# Patient Record
Sex: Male | Born: 1965 | Race: White | Hispanic: No | Marital: Married | Smoking: Former smoker
Health system: Southern US, Community
[De-identification: ages and names within clinical notes are randomized; demographics above are authoritative.]

## PROBLEM LIST (undated history)

## (undated) ENCOUNTER — Emergency Department: Disposition: A | Discharge status: Home / Self Care

## (undated) DIAGNOSIS — I10 Essential (primary) hypertension: Secondary | ICD-10-CM

## (undated) DIAGNOSIS — K219 Gastro-esophageal reflux disease without esophagitis: Secondary | ICD-10-CM

## (undated) DIAGNOSIS — J45909 Unspecified asthma, uncomplicated: Secondary | ICD-10-CM

## (undated) DIAGNOSIS — M1990 Unspecified osteoarthritis, unspecified site: Secondary | ICD-10-CM

## (undated) DIAGNOSIS — F419 Anxiety disorder, unspecified: Secondary | ICD-10-CM

## (undated) HISTORY — PX: OTHER SURGICAL HISTORY: SHX169

## (undated) HISTORY — PX: APPENDECTOMY: SHX54

---

## 2003-03-08 ENCOUNTER — Ambulatory Visit (HOSPITAL_COMMUNITY): Admission: RE | Admit: 2003-03-08 | Discharge: 2003-03-08 | Payer: Self-pay | Admitting: Neurology

## 2003-03-08 ENCOUNTER — Encounter: Payer: Self-pay | Admitting: Neurology

## 2004-03-22 ENCOUNTER — Emergency Department (HOSPITAL_COMMUNITY): Admission: EM | Admit: 2004-03-22 | Discharge: 2004-03-22 | Payer: Self-pay | Admitting: Emergency Medicine

## 2007-02-16 ENCOUNTER — Ambulatory Visit (HOSPITAL_BASED_OUTPATIENT_CLINIC_OR_DEPARTMENT_OTHER): Admission: RE | Admit: 2007-02-16 | Discharge: 2007-02-16 | Payer: Self-pay | Admitting: Orthopaedic Surgery

## 2010-11-05 NOTE — Op Note (Signed)
NAMEBERTIS, HUSTEAD                ACCOUNT NO.:  [id number]   MEDICAL RECORD NO.:  [id number]          PATIENT TYPE:  AMB   LOCATION:  DSC                          FACILITY:  [facility]   PHYSICIAN:  Sharolyn Douglas, M.D.        DATE OF BIRTH:  [birth year]   DATE OF PROCEDURE:  02/16/2007  DATE OF DISCHARGE:                               OPERATIVE REPORT   DIAGNOSIS:  Spondylosis T9-T10, T10-T11 with persistent thoracolumbar  pain.   PROCEDURE:  1. Bilateral fact joint injections at T9-T10 and T10-T11.  2. Fluoroscopic imaging used for needle placement of the above      injection.   SURGEON:  Sharolyn Douglas, M.D.   ASSISTANT:  None.   ANESTHESIA:  MAC plus local.   COMPLICATIONS:  None.   INDICATIONS:  The patient is a pleasant 45 year old male with persistent  thoracolumbar pain, thought to possibly be secondary to thoracolumbar  spondylosis. Now presents for diagnostic and potentially therapeutic  injections as listed above. Risks, benefits and alternatives were  reviewed.  The patient elected to proceed.   DESCRIPTION OF PROCEDURE:  After informed consent, he was taken to the  operating room.  He was turned prone.  The back was prepped and draped  in the usual sterile fashion.  He underwent sedation by anesthesia.  Fluoroscopy was brought into the field and the T9-T10 and T10-T11 facet  joints were visualized.  A 22-gauge 2-1/2 inch Quincke spinal needles  were advanced into the joints bilaterally at T9-T10 and T10-T11.  Aspiration showed no blood. Injection of 40 mg Depo-Medrol 1 mL 1%  preservative-free lidocaine into and around each joint at T9-T10, T10-  T11 bilaterally.  The patient tolerated the procedure well.  There were  no apparent complications.  He was transferred to recovery in stable  condition.  I reviewed post injection instructions with him. He should  follow up in 2 to 3 weeks.      Sharolyn Douglas, M.D.  Electronically Signed     MC/MEDQ  D:  02/16/2007  T:   02/16/2007  Job:  [id number]

## 2011-04-04 LAB — I-STAT 8, (EC8 V) (CONVERTED LAB)
Acid-base deficit: 2
HCT: 48
Hemoglobin: 16.3
Operator id: 112821
Potassium: 4.2
TCO2: 25
pCO2, Ven: 42.4 — ABNORMAL LOW
pH, Ven: 7.357 — ABNORMAL HIGH

## 2015-06-29 DIAGNOSIS — K529 Noninfective gastroenteritis and colitis, unspecified: Secondary | ICD-10-CM | POA: Diagnosis present

## 2015-10-03 DIAGNOSIS — M545 Low back pain, unspecified: Secondary | ICD-10-CM | POA: Insufficient documentation

## 2015-10-03 DIAGNOSIS — R569 Unspecified convulsions: Secondary | ICD-10-CM | POA: Insufficient documentation

## 2015-10-03 DIAGNOSIS — N400 Enlarged prostate without lower urinary tract symptoms: Secondary | ICD-10-CM | POA: Insufficient documentation

## 2015-10-03 DIAGNOSIS — G43009 Migraine without aura, not intractable, without status migrainosus: Secondary | ICD-10-CM | POA: Insufficient documentation

## 2015-10-03 DIAGNOSIS — M5481 Occipital neuralgia: Secondary | ICD-10-CM | POA: Insufficient documentation

## 2015-10-03 DIAGNOSIS — G894 Chronic pain syndrome: Secondary | ICD-10-CM | POA: Insufficient documentation

## 2015-10-03 DIAGNOSIS — M542 Cervicalgia: Secondary | ICD-10-CM | POA: Insufficient documentation

## 2015-10-03 DIAGNOSIS — I10 Essential (primary) hypertension: Secondary | ICD-10-CM | POA: Insufficient documentation

## 2015-10-03 DIAGNOSIS — R519 Headache, unspecified: Secondary | ICD-10-CM | POA: Insufficient documentation

## 2015-10-03 DIAGNOSIS — F32A Depression, unspecified: Secondary | ICD-10-CM | POA: Insufficient documentation

## 2015-10-03 DIAGNOSIS — J45909 Unspecified asthma, uncomplicated: Secondary | ICD-10-CM | POA: Insufficient documentation

## 2017-02-15 DIAGNOSIS — M5136 Other intervertebral disc degeneration, lumbar region: Secondary | ICD-10-CM | POA: Insufficient documentation

## 2017-02-15 DIAGNOSIS — M5126 Other intervertebral disc displacement, lumbar region: Secondary | ICD-10-CM | POA: Insufficient documentation

## 2017-02-15 DIAGNOSIS — S22089A Unspecified fracture of T11-T12 vertebra, initial encounter for closed fracture: Secondary | ICD-10-CM | POA: Insufficient documentation

## 2018-04-15 ENCOUNTER — Encounter: Payer: Self-pay | Admitting: Emergency Medicine

## 2018-04-27 ENCOUNTER — Ambulatory Visit: Payer: Medicare HMO | Admitting: Psychiatry

## 2018-04-27 ENCOUNTER — Encounter: Payer: Self-pay | Admitting: Psychiatry

## 2018-04-27 DIAGNOSIS — F331 Major depressive disorder, recurrent, moderate: Secondary | ICD-10-CM | POA: Diagnosis not present

## 2018-04-27 DIAGNOSIS — F14229 Cocaine dependence with intoxication, unspecified: Secondary | ICD-10-CM | POA: Diagnosis not present

## 2018-04-27 DIAGNOSIS — F1124 Opioid dependence with opioid-induced mood disorder: Secondary | ICD-10-CM

## 2018-04-27 MED ORDER — ARIPIPRAZOLE 20 MG PO TABS: 20.0000 mg | ORAL_TABLET | Freq: Every day | ORAL | 0 refills | Status: DC

## 2018-04-27 MED ORDER — DULOXETINE HCL 60 MG PO CPEP: 120.0000 mg | ORAL_CAPSULE | Freq: Every day | ORAL | 0 refills | Status: DC

## 2018-04-27 NOTE — Progress Notes (Signed)
Alan Nelson [id number] 1966/01/16 52 y.o.  Subjective:   Patient ID:  Alan Nelson is a 52 y.o. (DOB 15-Mar-1966) male.  Chief Complaint:  Chief Complaint  Patient presents with  . Depression  . Sleeping Problem    chronic initial insomnia  . Drug Problem    HPI Alan Nelson presents to the office today for follow-up of TRD.  Was hooked on pain pills and was craving when the doctor took him off morphine 200mg /D and oxycodone 60mg /D for a few years from Dr. Adelene Idler at a pain clinic.  They cut him drastically back bc they got in trouble for over prescribing.  Then got to street to get drugs and go onto cocaine.  Started it about a year.  None of opiates for a few months.  Started Suboxone recently DT craving cocaine.  Suboxone helped craving for cocaine too.  Still has using dreams.  Last use cocaine about 3 mos ago.  Not going to AA, NA bc don't need it.  Got wife and church support.  Couldn't tolerate desipramine DT sedation type side effects.  So he restarted duloxetine and Abilify and it seemed to help again.  Better motivation and less depression.  More desire to do things again.  Asked about increasing it further.     Review of Systems:  Review of Systems  Musculoskeletal: Positive for back pain.  Neurological: Negative for tremors and weakness.  Psychiatric/Behavioral: Negative for agitation, behavioral problems, confusion, decreased concentration, dysphoric mood, hallucinations, self-injury, sleep disturbance and suicidal ideas. The patient is not nervous/anxious and is not hyperactive.     Medications: I have reviewed the patient's current medications.  Current Outpatient Medications  Medication Sig Dispense Refill  . Buprenorphine HCl-Naloxone HCl (SUBOXONE) 8-2 MG FILM Place 16 mg of opioid under the tongue daily.    . DULoxetine (CYMBALTA) 60 MG capsule Take 2 capsules (120 mg total) by mouth daily. 180 capsule 0  . lamoTRIgine (LAMICTAL) 100 MG tablet Take 200  mg by mouth daily.     . ARIPiprazole (ABILIFY) 20 MG tablet Take 1 tablet (20 mg total) by mouth daily. 90 tablet 0   No current facility-administered medications for this visit.     Medication Side Effects: None  Allergies:  Allergies  Allergen Reactions  . Bupropion Other (See Comments)    Seizure   . Desipramine Other (See Comments)    Drunk feeling    History reviewed. No pertinent past medical history.  History reviewed. No pertinent family history.  Social History   Socioeconomic History  . Marital status: Married    Spouse name: Not on file  . Number of children: Not on file  . Years of education: Not on file  . Highest education level: Not on file  Occupational History  . Not on file  Social Needs  . Financial resource strain: Not on file  . Food insecurity:    Worry: Not on file    Inability: Not on file  . Transportation needs:    Medical: Not on file    Non-medical: Not on file  Tobacco Use  . Smoking status: Former Games developer  . Smokeless tobacco: Never Used  Substance and Sexual Activity  . Alcohol use: Not on file  . Drug use: Not on file  . Sexual activity: Not on file  Lifestyle  . Physical activity:    Days per week: Not on file    Minutes per session: Not on file  . Stress:  Not on file  Relationships  . Social connections:    Talks on phone: Not on file    Gets together: Not on file    Attends religious service: Not on file    Active member of club or organization: Not on file    Attends meetings of clubs or organizations: Not on file    Relationship status: Not on file  . Intimate partner violence:    Fear of current or ex partner: Not on file    Emotionally abused: Not on file    Physically abused: Not on file    Forced sexual activity: Not on file  Other Topics Concern  . Not on file  Social History Narrative  . Not on file    Past Medical History, Surgical history, Social history, and Family history were reviewed and updated as  appropriate.   Please see review of systems for further details on the patient's review from today.   Objective:   Physical Exam:  There were no vitals taken for this visit.  Physical Exam  Constitutional: He is oriented to person, place, and time. He appears well-developed. No distress.  Musculoskeletal: He exhibits no deformity.  Neurological: He is alert and oriented to person, place, and time. He displays no tremor. Coordination and gait normal.  Psychiatric: His speech is normal and behavior is normal. Judgment and thought content normal. His mood appears not anxious. His affect is blunt. His affect is not angry, not labile and not inappropriate. Cognition and memory are normal. He exhibits a depressed mood. He expresses no homicidal and no suicidal ideation. He expresses no suicidal plans and no homicidal plans.  Insight and judgment are improving No auditory or visual hallucinations. No delusions.  He is attentive.    Lab Review:     Component Value Date/Time   NA 137 02/16/2007 0727   K 4.2 02/16/2007 0727   CL 104 02/16/2007 0727   GLUCOSE 93 02/16/2007 0727   BUN 20 02/16/2007 0727       Component Value Date/Time   HGB 16.3 02/16/2007 0727   HCT 48.0 02/16/2007 0727    No results found for: POCLITH, LITHIUM   No results found for: PHENYTOIN, PHENOBARB, VALPROATE, CBMZ   .res Assessment: Plan:    Major depressive disorder, recurrent episode, moderate (HCC) - Plan: ARIPiprazole (ABILIFY) 20 MG tablet, DULoxetine (CYMBALTA) 60 MG capsule  Opioid dependence with opioid-induced mood disorder (HCC)  Cocaine dependence with intoxication with complication (HCC) - Plan: ARIPiprazole (ABILIFY) 20 MG tablet  Greater than 50% of face to face time with patient was spent on counseling and coordination of care. We discussed his chronic treatment resistant major depression.  He did not benefit from 8-week trial of Vraylar 1.5 and complained of side effects.  We are going to  switch him to desipramine as of his last visit that he could not tolerate 50 mg of desipramine because he felt drunk.  So on his own he restarted duloxetine and Abilify.  He states that this has started working again.  He was off of Abilify about 3 months and perhaps that has allowed him to become tolerant and effective again.  He wonders about increasing the dosage.  We discussed the pros of cons of that including side effect risks.  Disc AA and NA recommendations.  We discussed his drug dependence.  He refuses AA and NA because of spiritual reasons but states he has been accountable to his family and church members and people  are praying for him and holding him accountable about substance abuse.  He reports the Suboxone has significantly helped with his cravings for both opiates and cocaine and is not used in over 3 months.  We discussed more aggressive substance abuse treatment options should he have relapse.  He is agreeable to more aggressive treatment if he relapses.  OK per his request to try a high dosage of Abilify to 20 mg but this may not improve depression further.  Sometimes it helps cocaine craving. Discussed potential metabolic side effects associated with atypical antipsychotics, as well as potential risk for movement side effects. Advised pt to contact office if movement side effects occur.   This appointment was 40 minutes  Follow-up 8 weeks  Meredith Staggers MD, DFAPA No future appointments.  No orders of the defined types were placed in this encounter.     -------------------------------

## 2018-07-06 ENCOUNTER — Ambulatory Visit: Payer: Medicare HMO | Admitting: Psychiatry

## 2018-07-06 ENCOUNTER — Encounter: Payer: Self-pay | Admitting: Psychiatry

## 2018-07-06 DIAGNOSIS — F1124 Opioid dependence with opioid-induced mood disorder: Secondary | ICD-10-CM

## 2018-07-06 DIAGNOSIS — F339 Major depressive disorder, recurrent, unspecified: Secondary | ICD-10-CM | POA: Diagnosis not present

## 2018-07-06 DIAGNOSIS — F1421 Cocaine dependence, in remission: Secondary | ICD-10-CM | POA: Diagnosis not present

## 2018-07-06 DIAGNOSIS — F331 Major depressive disorder, recurrent, moderate: Secondary | ICD-10-CM

## 2018-07-06 MED ORDER — DULOXETINE HCL 60 MG PO CPEP: 120.0000 mg | ORAL_CAPSULE | Freq: Every day | ORAL | 0 refills | Status: DC

## 2018-07-06 NOTE — Patient Instructions (Signed)
Stop Abilify and start Latuda 20 mg for 1 week with at least 350 cal of food.  And evening meal would be ideal. After 1 week increase Latuda to 40 mg with a meal daily.  Use up all the samples and if it does not help call us back.

## 2018-07-06 NOTE — Progress Notes (Signed)
Alan Nelson [id number] [birth year] 53 y.o.  Subjective:   Patient ID:  Alan Nelson is a 53 y.o. (DOB [birth year]) male.  Chief Complaint:  Chief Complaint  Patient presents with  . Follow-up    Medication management    HPI  Last seen in September. Alan Nelson presents to the office today for follow-up of chronic depression.  Depression is not as severe as it has been in the past but remains.  "my thorn in the flesh".  Increased Abilify in November and seemed like it helped a little more and no SE.  Pt reports that mood is Anxious and Depressed and describes anxiety as Minimal. Anxiety symptoms include: Excessive Worry,. Pt reports has interrupted sleep, has frequent nighttime awakenings and is not rested upon awakening. Pt reports that appetite is good. Pt reports that energy is poor and down moderately and reduced activity in prior interests. Concentration is down slightly. Suicidal thoughts:  denied by patient.  Not very productive.  Says he took a computer test that said he was bipolar disorder.  Can get mad easily but faith has helped that.  We've discussed this many times and it's never been conclusive.  Couldn't tolerate the desipramine.  Multiple SE, blistered tongues, blurred vision, dizziness, slurred speech.  Just returned to the previous medications.  Psych med trials include Failed Vraylar, desipramine.  Wellbutrin which caused seizures, Lexapro, Viibryd, Abilify, Seroquel, sertraline, venlafaxine, Symbyax, Prozac, mirtazapine, lithium, lamotrigine, duloxetine 120 mg, Abilify 20 mg  Review of Systems:  Review of Systems  Musculoskeletal: Positive for back pain.  Neurological: Negative for tremors and weakness.  Psychiatric/Behavioral: Positive for dysphoric mood. Negative for agitation, behavioral problems, confusion, decreased concentration, hallucinations, self-injury, sleep disturbance and suicidal ideas. The patient is nervous/anxious. The patient is not  hyperactive.     Medications: I have reviewed the patient's current medications.  Current Outpatient Medications  Medication Sig Dispense Refill  . ARIPiprazole (ABILIFY) 20 MG tablet Take 1 tablet (20 mg total) by mouth daily. 90 tablet 0  . Buprenorphine HCl-Naloxone HCl (SUBOXONE) 8-2 MG FILM Place 16 mg of opioid under the tongue daily.    . DULoxetine (CYMBALTA) 60 MG capsule Take 2 capsules (120 mg total) by mouth daily. 180 capsule 0  . lamoTRIgine (LAMICTAL) 100 MG tablet Take 200 mg by mouth daily.     . rizatriptan (MAXALT) 5 MG tablet Take 5 mg by mouth as needed for migraine. May repeat in 2 hours if needed     No current facility-administered medications for this visit.     Medication Side Effects: None  Allergies:  Allergies  Allergen Reactions  . Bupropion Other (See Comments)    Seizure   . Desipramine Other (See Comments)    Drunk feeling    History reviewed. No pertinent past medical history.  History reviewed. No pertinent family history.  Social History   Socioeconomic History  . Marital status: Married    Spouse name: Not on file  . Number of children: Not on file  . Years of education: Not on file  . Highest education level: Not on file  Occupational History  . Not on file  Social Needs  . Financial resource strain: Not on file  . Food insecurity:    Worry: Not on file    Inability: Not on file  . Transportation needs:    Medical: Not on file    Non-medical: Not on file  Tobacco Use  . Smoking status: Former Games developer  .  Smokeless tobacco: Never Used  Substance and Sexual Activity  . Alcohol use: Not on file  . Drug use: Not on file  . Sexual activity: Not on file  Lifestyle  . Physical activity:    Days per week: Not on file    Minutes per session: Not on file  . Stress: Not on file  Relationships  . Social connections:    Talks on phone: Not on file    Gets together: Not on file    Attends religious service: Not on file    Active  member of club or organization: Not on file    Attends meetings of clubs or organizations: Not on file    Relationship status: Not on file  . Intimate partner violence:    Fear of current or ex partner: Not on file    Emotionally abused: Not on file    Physically abused: Not on file    Forced sexual activity: Not on file  Other Topics Concern  . Not on file  Social History Narrative  . Not on file    Past Medical History, Surgical history, Social history, and Family history were reviewed and updated as appropriate.   Please see review of systems for further details on the patient's review from today.   Objective:   Physical Exam:  There were no vitals taken for this visit.  Physical Exam  Lab Review:     Component Value Date/Time   NA 137 02/16/2007 0727   K 4.2 02/16/2007 0727   CL 104 02/16/2007 0727   GLUCOSE 93 02/16/2007 0727   BUN 20 02/16/2007 0727       Component Value Date/Time   HGB 16.3 02/16/2007 0727   HCT 48.0 02/16/2007 0727    No results found for: POCLITH, LITHIUM   No results found for: PHENYTOIN, PHENOBARB, VALPROATE, CBMZ   .res Assessment: Plan:    Recurrent major depression resistant to treatment (HCC)  Opioid dependence with opioid-induced mood disorder (HCC)  Cocaine dependence in remission (HCC)  Major depressive disorder, recurrent episode, moderate (HCC) - Plan: DULoxetine (CYMBALTA) 60 MG capsule RO bipolar variant  Alan Nelson  Has a history of opioid dependence which is mainly been in the form of prescription opioids but also chronic depression.  He is in counseling for the opioid dependence and on Suboxone.  The chronic depression has been treatment resistant.  Occasionally he has had some degree of benefit from the Cymbalta Abilify combination but still rates the depression as moderately severe.  He is not suicidal.  He is interested in trying another medication.  We again discussed the question about whether he has  underlying bipolar disorder or whether this is major depression.  I have never gotten a very clear history of mania from him.  Fortunately the options that remain would be effective for either type of depression.  Remaining options ECT, TMS, ketamine, Latuda, pramipexole,  Depakote, CBZ.  Disc SE each.  Kasandra KnudsenLatuda is easiest and most obvious.  Presence of Abilify might interfere with it's effectiveness.   Stop Abilify and start Latuda 20 mg for 1 week with at least 350 cal of food.  And evening meal would be ideal. After 1 week increase Latuda to 40 mg with a meal daily.  Use up all the samples and if it does not help call us back.  Discussed potential metabolic side effects associated with atypical antipsychotics, as well as potential risk for movement side effects. Advised pt to contact office  if movement side effects occur.   Rec address nocturia with PCP.  This appt was 30 mins.  FU  8 weeks  Meredith Staggers, MD, DFAPA    Please see After Visit Summary for patient specific instructions.  Future Appointments  Date Time Provider Department Center  09/03/2018 10:00 AM Cottle, Steva Ready., MD [facility] None    No orders of the defined types were placed in this encounter.     -------------------------------

## 2018-07-08 ENCOUNTER — Other Ambulatory Visit: Payer: Self-pay

## 2018-07-08 MED ORDER — LAMOTRIGINE 100 MG PO TABS: 200.0000 mg | ORAL_TABLET | Freq: Every day | ORAL | 1 refills | Status: DC

## 2018-09-03 ENCOUNTER — Ambulatory Visit: Payer: Medicare HMO | Admitting: Psychiatry

## 2018-09-15 ENCOUNTER — Encounter: Payer: Self-pay | Admitting: Psychiatry

## 2018-09-15 ENCOUNTER — Ambulatory Visit (INDEPENDENT_AMBULATORY_CARE_PROVIDER_SITE_OTHER): Payer: Medicare HMO | Admitting: Psychiatry

## 2018-09-15 ENCOUNTER — Other Ambulatory Visit: Payer: Self-pay

## 2018-09-15 DIAGNOSIS — F1421 Cocaine dependence, in remission: Secondary | ICD-10-CM

## 2018-09-15 DIAGNOSIS — F339 Major depressive disorder, recurrent, unspecified: Secondary | ICD-10-CM

## 2018-09-15 DIAGNOSIS — F1124 Opioid dependence with opioid-induced mood disorder: Secondary | ICD-10-CM | POA: Diagnosis not present

## 2018-09-15 NOTE — Progress Notes (Signed)
Alan Nelson [id number] 04/07/1966 53 y.o.  Subjective:   Patient ID:  Alan Nelson is a 53 y.o. (DOB 12/23/1965) male.  Chief Complaint:  Chief Complaint  Patient presents with  . Depression  . Follow-up    med changes    HPI  [name] presents to the office today for follow-up of chronic depression.  Last seen July 06, 2018.  At that last appointment because of chronic treatment resistant depression we stopped Abilify and started Latuda 20 mg with the plan to increase to 40 mg.  He was to continue duloxetine plus lamotrigine.  Didn't tell any difference on the Latuda and stopped it.  2 SE but can't remember it.  Stopped it.  About the same as always off the Abilify.  Not much diffference including energy.   Depression is not as severe as it has been in the past but remains.  "my thorn in the flesh". Pt reports that mood is Anxious and Depressed and describes anxiety as Minimal. Anxiety symptoms include: Excessive Worry,. Pt reports sleep is some better with 6-8 hours except nocturia.  Pt reports that appetite is good. Pt reports that energy is poor and down moderately and reduced activity in prior interests. Concentration is down slightly. Suicidal thoughts:  denied by patient.  Not very productive.  Says he took a computer test that said he was bipolar disorder.  Can get mad easily but faith has helped that.  We've discussed this many times and it's never been conclusive.  No unusual mood swings since here nor anger outbursts.  Working has helped his mood.  Putting up fences with a partner and really enjoy it .Still craves cocaine without relapses.  Couldn't tolerate the desipramine.  Multiple SE, blistered tongues, blurred vision, dizziness, slurred speech.  Just returned to the previous medications.  Psych med trials include Failed Vraylar, desipramineSE.  Wellbutrin which caused seizures, Lexapro, Viibryd, Abilify, Seroquel, sertraline, venlafaxine, Symbyax, Prozac,  mirtazapine, lithium, lamotrigine, duloxetine 120 mg, Abilify 20 mg  Review of Systems:  Review of Systems  HENT: Positive for congestion and sneezing.   Musculoskeletal: Positive for back pain.  Neurological: Negative for tremors and weakness.  Psychiatric/Behavioral: Positive for dysphoric mood. Negative for agitation, behavioral problems, confusion, decreased concentration, hallucinations, self-injury, sleep disturbance and suicidal ideas. The patient is nervous/anxious. The patient is not hyperactive.     Medications: I have reviewed the patient's current medications.  Current Outpatient Medications  Medication Sig Dispense Refill  . Buprenorphine HCl-Naloxone HCl (SUBOXONE) 8-2 MG FILM Place 16 mg of opioid under the tongue daily.    . DULoxetine (CYMBALTA) 60 MG capsule Take 2 capsules (120 mg total) by mouth daily. 180 capsule 0  . lamoTRIgine (LAMICTAL) 100 MG tablet Take 2 tablets (200 mg total) by mouth daily. 180 tablet 1  . rizatriptan (MAXALT) 5 MG tablet Take 5 mg by mouth as needed for migraine. May repeat in 2 hours if needed     No current facility-administered medications for this visit.     Medication Side Effects: None  Allergies:  Allergies  Allergen Reactions  . Bupropion Other (See Comments)    Seizure   . Desipramine Other (See Comments)    Drunk feeling    No past medical history on file.  No family history on file.  Social History   Socioeconomic History  . Marital status: Married    Spouse name: Not on file  . Number of children: Not on file  .  Years of education: Not on file  . Highest education level: Not on file  Occupational History  . Not on file  Social Needs  . Financial resource strain: Not on file  . Food insecurity:    Worry: Not on file    Inability: Not on file  . Transportation needs:    Medical: Not on file    Non-medical: Not on file  Tobacco Use  . Smoking status: Former Games developer  . Smokeless tobacco: Never Used   Substance and Sexual Activity  . Alcohol use: Not on file  . Drug use: Not on file  . Sexual activity: Not on file  Lifestyle  . Physical activity:    Days per week: Not on file    Minutes per session: Not on file  . Stress: Not on file  Relationships  . Social connections:    Talks on phone: Not on file    Gets together: Not on file    Attends religious service: Not on file    Active member of club or organization: Not on file    Attends meetings of clubs or organizations: Not on file    Relationship status: Not on file  . Intimate partner violence:    Fear of current or ex partner: Not on file    Emotionally abused: Not on file    Physically abused: Not on file    Forced sexual activity: Not on file  Other Topics Concern  . Not on file  Social History Narrative  . Not on file    Past Medical History, Surgical history, Social history, and Family history were reviewed and updated as appropriate.   Please see review of systems for further details on the patient's review from today.   Objective:   Physical Exam:  There were no vitals taken for this visit.  Physical Exam Neurological:     Mental Status: He is alert.     Cranial Nerves: No dysarthria.  Psychiatric:        Attention and Perception: He is attentive. He does not perceive auditory or visual hallucinations.        Mood and Affect: Mood is depressed. Mood is not elated. Affect is not tearful.        Speech: Speech is not rapid and pressured or slurred.        Behavior: Behavior is not agitated, slowed or aggressive.        Thought Content: Thought content is not paranoid. Thought content does not include homicidal or suicidal ideation.        Cognition and Memory: Cognition normal.     Comments: Fair insight and judgment.  Some degree of anxiety but not severe.  Overall level of depression is somewhat improved by feeling productive at work.     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  He is in counseling for the opioid dependence and on Suboxone.  The chronic depression has been treatment resistant.  Occasionally he has had some degree of benefit from the Cymbalta Abilify combination but still rates the depression as moderate.  He is not suicidal.  He is no worse off of the Abilify and specifically denies mood swings.  He did not receive any benefit from the [place] and stopped it.  Overall he is satisfied to leave the current medication regimen alone.  Still has residual depression.  Remaining options ECT, TMS, ketamine,  pramipexole,  Depakote, CBZ.  Disc SE each.   Rec address nocturia with PCP.  This appt was 15 mins.  FU 6 months since he does not want to pursue additional medicine changes  I connected with patient by a video enabled telemedicine application or telephone, with their informed consent, and verified patient privacy and that I am speaking with the correct person using two identifiers.  I was located at office and patient at work.  Meredith Staggers, MD, DFAPA    Please see After Visit Summary for patient specific instructions.  No future appointments.  No orders of the defined types were placed in this encounter.     -------------------------------

## 2019-08-30 DIAGNOSIS — F1910 Other psychoactive substance abuse, uncomplicated: Secondary | ICD-10-CM | POA: Insufficient documentation

## 2019-08-30 DIAGNOSIS — F1410 Cocaine abuse, uncomplicated: Secondary | ICD-10-CM | POA: Insufficient documentation

## 2019-08-30 DIAGNOSIS — F1121 Opioid dependence, in remission: Secondary | ICD-10-CM | POA: Insufficient documentation

## 2019-10-29 DIAGNOSIS — F1990 Other psychoactive substance use, unspecified, uncomplicated: Secondary | ICD-10-CM | POA: Insufficient documentation

## 2020-06-14 ENCOUNTER — Emergency Department
Admission: EM | Admit: 2020-06-14 | Discharge: 2020-06-14 | Payer: Medicare Other | Attending: Emergency Medicine | Admitting: Emergency Medicine

## 2020-06-14 ENCOUNTER — Other Ambulatory Visit: Payer: Self-pay

## 2020-06-14 DIAGNOSIS — Z87891 Personal history of nicotine dependence: Secondary | ICD-10-CM | POA: Insufficient documentation

## 2020-06-14 DIAGNOSIS — F1123 Opioid dependence with withdrawal: Secondary | ICD-10-CM | POA: Diagnosis not present

## 2020-06-14 DIAGNOSIS — R6889 Other general symptoms and signs: Secondary | ICD-10-CM

## 2020-06-14 LAB — CBC
HCT: 43.5 % (ref 39.0–52.0)
Hemoglobin: 14.7 g/dL (ref 13.0–17.0)
MCH: 32.3 pg (ref 26.0–34.0)
MCHC: 33.8 g/dL (ref 30.0–36.0)
MCV: 95.6 fL (ref 80.0–100.0)
Platelets: 448 10*3/uL — ABNORMAL HIGH (ref 150–400)
RBC: 4.55 MIL/uL (ref 4.22–5.81)
RDW: 13.5 % (ref 11.5–15.5)
WBC: 7.9 10*3/uL (ref 4.0–10.5)
nRBC: 0 % (ref 0.0–0.2)

## 2020-06-14 LAB — BASIC METABOLIC PANEL
Anion gap: 8 (ref 5–15)
BUN: 14 mg/dL (ref 6–20)
CO2: 29 mmol/L (ref 22–32)
Calcium: 9.2 mg/dL (ref 8.9–10.3)
Chloride: 103 mmol/L (ref 98–111)
Creatinine, Ser: 0.76 mg/dL (ref 0.61–1.24)
GFR, Estimated: >60 mL/min (ref >60)
Glucose, Bld: 85 mg/dL (ref 70–99)
Potassium: 4 mmol/L (ref 3.5–5.1)
Sodium: 140 mmol/L (ref 135–145)

## 2020-06-14 MED ORDER — ACETAMINOPHEN 500 MG PO TABS
1000.0000 mg | ORAL_TABLET | Freq: Once | ORAL | Status: AC
  Administered 2020-06-14: 1000 mg via ORAL
  Filled 2020-06-14: qty 2

## 2020-06-14 MED ORDER — ONDANSETRON HCL 4 MG/2ML IJ SOLN
4.0000 mg | Freq: Once | INTRAMUSCULAR | Status: AC
  Administered 2020-06-14: 4 mg via INTRAVENOUS
  Filled 2020-06-14: qty 2

## 2020-06-14 MED ORDER — SODIUM CHLORIDE 0.9 % IV BOLUS
1000.0000 mL | Freq: Once | INTRAVENOUS | Status: AC
  Administered 2020-06-14: 1000 mL via INTRAVENOUS

## 2020-06-14 MED ORDER — ONDANSETRON HCL 4 MG PO TABS: 4.0000 mg | ORAL_TABLET | Freq: Three times a day (TID) | ORAL | 0 refills | Status: DC | PRN

## 2020-06-14 MED ORDER — ACETAMINOPHEN 500 MG PO TABS: 500.0000 mg | ORAL_TABLET | Freq: Four times a day (QID) | ORAL | 2 refills | Status: DC | PRN

## 2020-06-14 NOTE — ED Notes (Signed)
Spoke to Consulting civil engineer and EDP about pt's safety being discharged with documented CIWA and COWS totals. EDP aware and states patient is safe to be discharged.  Patient to be discharged in deputies custody. Discharge paperwork and perscription given to officer.

## 2020-06-14 NOTE — ED Provider Notes (Signed)
[facility] Emergency Department Provider Note   ____________________________________________   I have reviewed the triage vital signs and the nursing notes.   HISTORY  Chief Complaint Withdrawal   History limited by: Not Limited   HPI Alan Nelson is a 54 y.o. male who presents to the emergency department today because of concern for withdrawal. He states that he is withdrawing from suboxone and cocaine. Also has some concern for withdrawing from alcohol but states he only started drinking that a couple of months ago. The patient states that he is having body aches, and having nausea. The patient says that he is most concerned about the withdrawal from suboxone.    Records reviewed. Per medical record review patient has a history of suboxone use, depression.  History reviewed. No pertinent past medical history.  There are no problems to display for this patient.   History reviewed. No pertinent surgical history.  Prior to Admission medications   Medication Sig Start Date End Date Taking? Authorizing Provider  Buprenorphine HCl-Naloxone HCl (SUBOXONE) 8-2 MG FILM Place 16 mg of opioid under the tongue daily. 02/22/18   [provider]  DULoxetine (CYMBALTA) 60 MG capsule Take 2 capsules (120 mg total) by mouth daily. 07/06/18   Cottle, Steva Ready., MD  lamoTRIgine (LAMICTAL) 100 MG tablet Take 2 tablets (200 mg total) by mouth daily. 07/08/18   Cottle, Steva Ready., MD  rizatriptan (MAXALT) 5 MG tablet Take 5 mg by mouth as needed for migraine. May repeat in 2 hours if needed    [provider]    Allergies Bupropion and Desipramine  History reviewed. No pertinent family history.  Social History Social History   Tobacco Use   Smoking status: Former Smoker   Smokeless tobacco: Never Used  Substance Use Topics   Alcohol use: Yes    Comment: daily   Drug use: Yes    Types: Cocaine    Review of Systems Constitutional: No  fever/chills Eyes: No visual changes. ENT: No sore throat. Cardiovascular: Denies chest pain. Respiratory: Denies shortness of breath. Gastrointestinal: Positive for nausea. Genitourinary: Negative for dysuria. Musculoskeletal: Negative for back pain. Skin: Negative for rash. Neurological: Negative for headaches, focal weakness or numbness.  ____________________________________________   PHYSICAL EXAM:  VITAL SIGNS: ED Triage Vitals  Enc Vitals Group     BP 06/14/20 1941 (!) 140/91     Pulse Rate 06/14/20 1941 68     Resp 06/14/20 1941 16     Temp --      Temp src --      SpO2 06/14/20 1941 98 %     Weight 06/14/20 1943 185 lb (83.9 kg)     Height 06/14/20 1943 6' (1.829 m)     Head Circumference --      Peak Flow --      Pain Score 06/14/20 1946 6   Constitutional: Alert and oriented.  Eyes: Conjunctivae are normal.  ENT      Head: Normocephalic and atraumatic.      Nose: No congestion/rhinnorhea.      Mouth/Throat: Mucous membranes are moist.      Neck: No stridor. Hematological/Lymphatic/Immunilogical: No cervical lymphadenopathy. Cardiovascular: Normal rate, regular rhythm.  No murmurs, rubs, or gallops.  Respiratory: Normal respiratory effort without tachypnea nor retractions. Breath sounds are clear and equal bilaterally. No wheezes/rales/rhonchi. Gastrointestinal: Soft and non tender. No rebound. No guarding.  Genitourinary: Deferred Musculoskeletal: Normal range of motion in all extremities. No lower extremity edema.  Neurologic:  Normal speech and language. No gross focal neurologic deficits are appreciated.  Skin:  Skin is warm, dry and intact. No rash noted. Psychiatric: Mood and affect are normal. Speech and behavior are normal. Patient exhibits appropriate insight and judgment.  ____________________________________________    LABS (pertinent positives/negatives)  CBC wbc 7.9, hgb 14.7, plt 448 BMP  wnl ____________________________________________   EKG  I, Phineas Semen, attending physician, personally viewed and interpreted this EKG  EKG Time: 1938 Rate: 93 Rhythm: sinus rhythm Axis: left axis deviation Intervals: qtc 456 QRS: narrow ST changes: no st elevation Impression: abnormal ekg  ____________________________________________    RADIOLOGY  None  ____________________________________________   PROCEDURES  Procedures  ____________________________________________   INITIAL IMPRESSION / ASSESSMENT AND PLAN / ED COURSE  Pertinent labs & imaging results that were available during my care of the patient were reviewed by me and considered in my medical decision making (see chart for details).   Patient presented to the emergency department today because of concerns for withdrawal primarily from Suboxone.  On exam patient is awake and alert.  No acute distress.  Blood work here without concerning abnormalities.  Patient was given IV fluids as well as medication to help with symptoms.  Did discuss with patient we will discharge him with prescriptions for further symptom control.  ____________________________________________   FINAL CLINICAL IMPRESSION(S) / ED DIAGNOSES  Final diagnoses:  Withdrawal complaint     Note: This dictation was prepared with Dragon dictation. Any transcriptional errors that result from this process are unintentional     Phineas Semen, MD 06/14/20 2132

## 2020-06-14 NOTE — Discharge Instructions (Addendum)
Please seek medical attention for any high fevers, chest pain, shortness of breath, change in behavior, persistent vomiting, bloody stool or any other new or concerning symptoms.  

## 2020-06-14 NOTE — ED Triage Notes (Signed)
Patient from home via [facility]. Patient c/o withdrawal from suboxone and cocaine. Patient is ambulatory and A&O x4.

## 2020-07-24 ENCOUNTER — Inpatient Hospital Stay: Admitting: Anesthesiology

## 2020-07-24 ENCOUNTER — Emergency Department

## 2020-07-24 ENCOUNTER — Encounter: Admission: EM | Payer: Self-pay | Attending: Obstetrics and Gynecology

## 2020-07-24 ENCOUNTER — Other Ambulatory Visit: Payer: Self-pay

## 2020-07-24 ENCOUNTER — Encounter: Payer: Self-pay | Admitting: Family Medicine

## 2020-07-24 ENCOUNTER — Inpatient Hospital Stay
Admission: EM | Admit: 2020-07-24 | Discharge: 2020-07-30 | DRG: 853 | Attending: Obstetrics and Gynecology | Admitting: Obstetrics and Gynecology

## 2020-07-24 DIAGNOSIS — Z20822 Contact with and (suspected) exposure to covid-19: Secondary | ICD-10-CM | POA: Diagnosis present

## 2020-07-24 DIAGNOSIS — R6521 Severe sepsis with septic shock: Secondary | ICD-10-CM | POA: Diagnosis present

## 2020-07-24 DIAGNOSIS — F1120 Opioid dependence, uncomplicated: Secondary | ICD-10-CM | POA: Diagnosis present

## 2020-07-24 DIAGNOSIS — D75839 Thrombocytosis, unspecified: Secondary | ICD-10-CM | POA: Diagnosis present

## 2020-07-24 DIAGNOSIS — F419 Anxiety disorder, unspecified: Secondary | ICD-10-CM | POA: Diagnosis present

## 2020-07-24 DIAGNOSIS — E875 Hyperkalemia: Secondary | ICD-10-CM | POA: Diagnosis present

## 2020-07-24 DIAGNOSIS — A419 Sepsis, unspecified organism: Principal | ICD-10-CM | POA: Diagnosis present

## 2020-07-24 DIAGNOSIS — E876 Hypokalemia: Secondary | ICD-10-CM | POA: Diagnosis present

## 2020-07-24 DIAGNOSIS — N170 Acute kidney failure with tubular necrosis: Secondary | ICD-10-CM | POA: Diagnosis present

## 2020-07-24 DIAGNOSIS — K529 Noninfective gastroenteritis and colitis, unspecified: Secondary | ICD-10-CM | POA: Diagnosis present

## 2020-07-24 DIAGNOSIS — E871 Hypo-osmolality and hyponatremia: Secondary | ICD-10-CM | POA: Diagnosis present

## 2020-07-24 DIAGNOSIS — G8929 Other chronic pain: Secondary | ICD-10-CM | POA: Diagnosis present

## 2020-07-24 DIAGNOSIS — E43 Unspecified severe protein-calorie malnutrition: Secondary | ICD-10-CM | POA: Diagnosis present

## 2020-07-24 DIAGNOSIS — K436 Other and unspecified ventral hernia with obstruction, without gangrene: Secondary | ICD-10-CM | POA: Diagnosis not present

## 2020-07-24 DIAGNOSIS — K219 Gastro-esophageal reflux disease without esophagitis: Secondary | ICD-10-CM | POA: Diagnosis present

## 2020-07-24 DIAGNOSIS — K567 Ileus, unspecified: Secondary | ICD-10-CM | POA: Diagnosis not present

## 2020-07-24 DIAGNOSIS — I959 Hypotension, unspecified: Secondary | ICD-10-CM

## 2020-07-24 DIAGNOSIS — E878 Other disorders of electrolyte and fluid balance, not elsewhere classified: Secondary | ICD-10-CM | POA: Diagnosis present

## 2020-07-24 DIAGNOSIS — F319 Bipolar disorder, unspecified: Secondary | ICD-10-CM | POA: Diagnosis present

## 2020-07-24 DIAGNOSIS — N400 Enlarged prostate without lower urinary tract symptoms: Secondary | ICD-10-CM | POA: Diagnosis present

## 2020-07-24 DIAGNOSIS — Z6821 Body mass index (BMI) 21.0-21.9, adult: Secondary | ICD-10-CM

## 2020-07-24 DIAGNOSIS — G40909 Epilepsy, unspecified, not intractable, without status epilepticus: Secondary | ICD-10-CM | POA: Diagnosis present

## 2020-07-24 DIAGNOSIS — N179 Acute kidney failure, unspecified: Secondary | ICD-10-CM | POA: Diagnosis not present

## 2020-07-24 DIAGNOSIS — R6520 Severe sepsis without septic shock: Secondary | ICD-10-CM | POA: Diagnosis not present

## 2020-07-24 DIAGNOSIS — G43909 Migraine, unspecified, not intractable, without status migrainosus: Secondary | ICD-10-CM | POA: Diagnosis present

## 2020-07-24 DIAGNOSIS — E872 Acidosis: Secondary | ICD-10-CM | POA: Diagnosis present

## 2020-07-24 DIAGNOSIS — K56601 Complete intestinal obstruction, unspecified as to cause: Secondary | ICD-10-CM

## 2020-07-24 DIAGNOSIS — Z87891 Personal history of nicotine dependence: Secondary | ICD-10-CM

## 2020-07-24 DIAGNOSIS — K559 Vascular disorder of intestine, unspecified: Secondary | ICD-10-CM | POA: Diagnosis present

## 2020-07-24 DIAGNOSIS — I10 Essential (primary) hypertension: Secondary | ICD-10-CM | POA: Diagnosis present

## 2020-07-24 DIAGNOSIS — K56609 Unspecified intestinal obstruction, unspecified as to partial versus complete obstruction: Secondary | ICD-10-CM | POA: Diagnosis present

## 2020-07-24 DIAGNOSIS — M549 Dorsalgia, unspecified: Secondary | ICD-10-CM | POA: Diagnosis present

## 2020-07-24 DIAGNOSIS — K430 Incisional hernia with obstruction, without gangrene: Secondary | ICD-10-CM | POA: Diagnosis not present

## 2020-07-24 DIAGNOSIS — R569 Unspecified convulsions: Secondary | ICD-10-CM | POA: Diagnosis not present

## 2020-07-24 DIAGNOSIS — Z9081 Acquired absence of spleen: Secondary | ICD-10-CM

## 2020-07-24 DIAGNOSIS — K460 Unspecified abdominal hernia with obstruction, without gangrene: Secondary | ICD-10-CM

## 2020-07-24 DIAGNOSIS — K431 Incisional hernia with gangrene: Secondary | ICD-10-CM | POA: Diagnosis present

## 2020-07-24 DIAGNOSIS — Z79899 Other long term (current) drug therapy: Secondary | ICD-10-CM

## 2020-07-24 DIAGNOSIS — F1410 Cocaine abuse, uncomplicated: Secondary | ICD-10-CM | POA: Diagnosis present

## 2020-07-24 DIAGNOSIS — K9189 Other postprocedural complications and disorders of digestive system: Secondary | ICD-10-CM

## 2020-07-24 HISTORY — PX: LAPAROTOMY: SHX154

## 2020-07-24 LAB — CBC WITH DIFFERENTIAL/PLATELET
Abs Immature Granulocytes: 0.05 10*3/uL (ref 0.00–0.07)
Basophils Absolute: 0 10*3/uL (ref 0.0–0.1)
Basophils Relative: 0 %
Eosinophils Absolute: 0 10*3/uL (ref 0.0–0.5)
Eosinophils Relative: 0 %
HCT: 47.2 % (ref 39.0–52.0)
Hemoglobin: 16.9 g/dL (ref 13.0–17.0)
Immature Granulocytes: 0 %
Lymphocytes Relative: 8 %
Lymphs Abs: 0.9 10*3/uL (ref 0.7–4.0)
MCH: 32.2 pg (ref 26.0–34.0)
MCHC: 35.8 g/dL (ref 30.0–36.0)
MCV: 89.9 fL (ref 80.0–100.0)
Monocytes Absolute: 1 10*3/uL (ref 0.1–1.0)
Monocytes Relative: 9 %
Neutro Abs: 9.3 10*3/uL — ABNORMAL HIGH (ref 1.7–7.7)
Neutrophils Relative %: 83 %
Platelets: 406 10*3/uL — ABNORMAL HIGH (ref 150–400)
RBC: 5.25 MIL/uL (ref 4.22–5.81)
RDW: 12.2 % (ref 11.5–15.5)
WBC: 11.2 10*3/uL — ABNORMAL HIGH (ref 4.0–10.5)
nRBC: 0 % (ref 0.0–0.2)

## 2020-07-24 LAB — BASIC METABOLIC PANEL
Anion gap: 30 — ABNORMAL HIGH (ref 5–15)
Anion gap: 27 — ABNORMAL HIGH (ref 5–15)
BUN: 102 mg/dL — ABNORMAL HIGH (ref 6–20)
BUN: 105 mg/dL — ABNORMAL HIGH (ref 6–20)
CO2: 22 mmol/L (ref 22–32)
CO2: 26 mmol/L (ref 22–32)
Calcium: 7 mg/dL — ABNORMAL LOW (ref 8.9–10.3)
Calcium: 7.5 mg/dL — ABNORMAL LOW (ref 8.9–10.3)
Chloride: 75 mmol/L — ABNORMAL LOW (ref 98–111)
Chloride: 72 mmol/L — ABNORMAL LOW (ref 98–111)
Creatinine, Ser: 5.25 mg/dL — ABNORMAL HIGH (ref 0.61–1.24)
Creatinine, Ser: 5.83 mg/dL — ABNORMAL HIGH (ref 0.61–1.24)
GFR, Estimated: 12 mL/min — ABNORMAL LOW (ref >60)
GFR, Estimated: 11 mL/min — ABNORMAL LOW (ref >60)
Glucose, Bld: 116 mg/dL — ABNORMAL HIGH (ref 70–99)
Glucose, Bld: 79 mg/dL (ref 70–99)
Potassium: 2.7 mmol/L — CL (ref 3.5–5.1)
Potassium: 3.6 mmol/L (ref 3.5–5.1)
Sodium: 127 mmol/L — ABNORMAL LOW (ref 135–145)
Sodium: 125 mmol/L — ABNORMAL LOW (ref 135–145)

## 2020-07-24 LAB — HEPATIC FUNCTION PANEL
ALT: 15 U/L (ref 0–44)
AST: 34 U/L (ref 15–41)
Albumin: 3.5 g/dL (ref 3.5–5.0)
Alkaline Phosphatase: 54 U/L (ref 38–126)
Bilirubin, Direct: 0.2 mg/dL (ref 0.0–0.2)
Indirect Bilirubin: 1.2 mg/dL — ABNORMAL HIGH (ref 0.3–0.9)
Total Bilirubin: 1.4 mg/dL — ABNORMAL HIGH (ref 0.3–1.2)
Total Protein: 7.4 g/dL (ref 6.5–8.1)

## 2020-07-24 LAB — TROPONIN I (HIGH SENSITIVITY)
Troponin I (High Sensitivity): 145 ng/L (ref <18)
Troponin I (High Sensitivity): 165 ng/L (ref <18)

## 2020-07-24 LAB — LACTIC ACID, PLASMA
Lactic Acid, Venous: 4.9 mmol/L (ref 0.5–1.9)
Lactic Acid, Venous: 3 mmol/L (ref 0.5–1.9)

## 2020-07-24 LAB — MAGNESIUM: Magnesium: 2.6 mg/dL — ABNORMAL HIGH (ref 1.7–2.4)

## 2020-07-24 LAB — BLOOD GAS, VENOUS
Acid-Base Excess: 6.6 mmol/L — ABNORMAL HIGH (ref 0.0–2.0)
Bicarbonate: 32.5 mmol/L — ABNORMAL HIGH (ref 20.0–28.0)
O2 Saturation: 28.5 %
Patient temperature: 37
pCO2, Ven: 49 mmHg (ref 44.0–60.0)
pH, Ven: 7.43 (ref 7.250–7.430)
pO2, Ven: <31 mmHg — CL (ref 32.0–45.0)

## 2020-07-24 LAB — CBG MONITORING, ED: Glucose-Capillary: 115 mg/dL — ABNORMAL HIGH (ref 70–99)

## 2020-07-24 LAB — PROTIME-INR
INR: 1.3 — ABNORMAL HIGH (ref 0.8–1.2)
Prothrombin Time: 15.5 seconds — ABNORMAL HIGH (ref 11.4–15.2)

## 2020-07-24 LAB — TYPE AND SCREEN
ABO/RH(D): O POS
Antibody Screen: NEGATIVE

## 2020-07-24 LAB — SARS CORONAVIRUS 2 BY RT PCR (HOSPITAL ORDER, PERFORMED IN ~~LOC~~ HOSPITAL LAB): SARS Coronavirus 2: NEGATIVE

## 2020-07-24 LAB — APTT: aPTT: 30 seconds (ref 24–36)

## 2020-07-24 SURGERY — LAPAROTOMY, EXPLORATORY
Anesthesia: General | Site: Abdomen

## 2020-07-24 MED ORDER — FENTANYL CITRATE (PF) 250 MCG/5ML IJ SOLN
INTRAMUSCULAR | Status: AC
  Filled 2020-07-24: qty 5

## 2020-07-24 MED ORDER — FENTANYL CITRATE (PF) 100 MCG/2ML IJ SOLN
INTRAMUSCULAR | Status: DC | PRN
  Administered 2020-07-24 (×2): 50 ug via INTRAVENOUS

## 2020-07-24 MED ORDER — LORAZEPAM 2 MG/ML IJ SOLN
0.5000 mg | Freq: Once | INTRAMUSCULAR | Status: AC
  Administered 2020-07-24: 0.5 mg via INTRAVENOUS
  Filled 2020-07-24: qty 1

## 2020-07-24 MED ORDER — BUPIVACAINE LIPOSOME 1.3 % IJ SUSP
INTRAMUSCULAR | Status: AC
  Filled 2020-07-24: qty 20

## 2020-07-24 MED ORDER — SODIUM CHLORIDE 0.9 % IV BOLUS
1000.0000 mL | Freq: Once | INTRAVENOUS | Status: AC
  Administered 2020-07-24: 1000 mL via INTRAVENOUS

## 2020-07-24 MED ORDER — SUCCINYLCHOLINE CHLORIDE 20 MG/ML IJ SOLN
INTRAMUSCULAR | Status: DC | PRN
  Administered 2020-07-24: 100 mg via INTRAVENOUS

## 2020-07-24 MED ORDER — LIDOCAINE HCL (PF) 2 % IJ SOLN
INTRAMUSCULAR | Status: AC
  Filled 2020-07-24: qty 5

## 2020-07-24 MED ORDER — POTASSIUM CHLORIDE 10 MEQ/100ML IV SOLN
10.0000 meq | Freq: Once | INTRAVENOUS | Status: AC
  Administered 2020-07-24: 10 meq via INTRAVENOUS
  Filled 2020-07-24: qty 100

## 2020-07-24 MED ORDER — SODIUM CHLORIDE (PF) 0.9 % IJ SOLN
INTRAMUSCULAR | Status: AC
  Filled 2020-07-24: qty 50

## 2020-07-24 MED ORDER — PROPOFOL 10 MG/ML IV BOLUS
INTRAVENOUS | Status: DC | PRN
  Administered 2020-07-24: 160 mg via INTRAVENOUS

## 2020-07-24 MED ORDER — ONDANSETRON HCL 4 MG/2ML IJ SOLN: 4.0000 mg | Freq: Once | INTRAMUSCULAR | Status: DC | PRN

## 2020-07-24 MED ORDER — PIPERACILLIN-TAZOBACTAM 3.375 G IVPB 30 MIN
3.3750 g | Freq: Once | INTRAVENOUS | Status: AC
  Administered 2020-07-24: 3.375 g via INTRAVENOUS
  Filled 2020-07-24: qty 50

## 2020-07-24 MED ORDER — SUCCINYLCHOLINE CHLORIDE 200 MG/10ML IV SOSY
PREFILLED_SYRINGE | INTRAVENOUS | Status: AC
  Filled 2020-07-24: qty 10

## 2020-07-24 MED ORDER — PROPOFOL 10 MG/ML IV BOLUS
INTRAVENOUS | Status: AC
  Filled 2020-07-24: qty 20

## 2020-07-24 MED ORDER — SODIUM CHLORIDE 0.9 % IV SOLN: INTRAVENOUS | Status: DC

## 2020-07-24 MED ORDER — LIDOCAINE HCL (CARDIAC) PF 100 MG/5ML IV SOSY
PREFILLED_SYRINGE | INTRAVENOUS | Status: DC | PRN
  Administered 2020-07-24: 100 mg via INTRAVENOUS

## 2020-07-24 MED ORDER — PHENYLEPHRINE HCL (PRESSORS) 10 MG/ML IV SOLN
INTRAVENOUS | Status: DC | PRN
  Administered 2020-07-24: 100 ug via INTRAVENOUS
  Administered 2020-07-24: 200 ug via INTRAVENOUS

## 2020-07-24 MED ORDER — ONDANSETRON HCL 4 MG/2ML IJ SOLN
4.0000 mg | Freq: Once | INTRAMUSCULAR | Status: AC
  Administered 2020-07-24: 4 mg via INTRAVENOUS
  Filled 2020-07-24: qty 2

## 2020-07-24 MED ORDER — LORAZEPAM 2 MG/ML IJ SOLN: 1.0000 mg | INTRAMUSCULAR | Status: DC | PRN

## 2020-07-24 MED ORDER — FENTANYL CITRATE (PF) 100 MCG/2ML IJ SOLN
INTRAMUSCULAR | Status: AC
  Filled 2020-07-24: qty 2

## 2020-07-24 MED ORDER — FENTANYL CITRATE (PF) 100 MCG/2ML IJ SOLN
25.0000 ug | INTRAMUSCULAR | Status: DC | PRN
Start: 2020-07-24 — End: 2020-07-25

## 2020-07-24 MED ORDER — BUPIVACAINE-EPINEPHRINE (PF) 0.25% -1:200000 IJ SOLN
INTRAMUSCULAR | Status: AC
  Filled 2020-07-24: qty 30

## 2020-07-24 MED ORDER — ROCURONIUM BROMIDE 10 MG/ML (PF) SYRINGE
PREFILLED_SYRINGE | INTRAVENOUS | Status: AC
  Filled 2020-07-24: qty 10

## 2020-07-24 SURGICAL SUPPLY — 55 items
APL PRP STRL LF DISP 70% ISPRP (MISCELLANEOUS) ×1
APPLIER CLIP 11 MED OPEN (CLIP)
APPLIER CLIP 13 LRG OPEN (CLIP)
APR CLP LRG 13 20 CLIP (CLIP)
APR CLP MED 11 20 MLT OPN (CLIP)
BARRIER ADH SEPRAFILM 3INX5IN (MISCELLANEOUS) IMPLANT
BLADE CLIPPER SURG (BLADE) ×2 IMPLANT
BLADE SURG SZ10 CARB STEEL (BLADE) ×2 IMPLANT
BRR ADH 5X3 SEPRAFILM 2 SHT (MISCELLANEOUS)
CANISTER SUCT 3000ML PPV (MISCELLANEOUS) ×2 IMPLANT
CHLORAPREP W/TINT 26 (MISCELLANEOUS) ×2 IMPLANT
CLIP APPLIE 11 MED OPEN (CLIP) IMPLANT
CLIP APPLIE 13 LRG OPEN (CLIP) IMPLANT
COVER BACK TABLE REUSABLE LG (DRAPES) IMPLANT
COVER WAND RF STERILE (DRAPES) ×2 IMPLANT
DRAPE LAPAROTOMY 100X77 ABD (DRAPES) ×2 IMPLANT
DRSG OPSITE POSTOP 4X10 (GAUZE/BANDAGES/DRESSINGS) IMPLANT
DRSG OPSITE POSTOP 4X12 (GAUZE/BANDAGES/DRESSINGS) IMPLANT
ELECT BLADE 6.5 EXT (BLADE) ×2 IMPLANT
ELECT CAUTERY BLADE 6.4 (BLADE) ×1 IMPLANT
ELECT REM PT RETURN 9FT ADLT (ELECTROSURGICAL) ×4
ELECTRODE REM PT RTRN 9FT ADLT (ELECTROSURGICAL) ×2 IMPLANT
GLOVE BIO SURGEON STRL SZ7 (GLOVE) ×6 IMPLANT
GLOVE BIOGEL PI IND STRL 8.5 (GLOVE) IMPLANT
GLOVE BIOGEL PI INDICATOR 8.5 (GLOVE) ×2
GLOVE SURG ENC MOIS LTX SZ7 (GLOVE) ×2 IMPLANT
GOWN STRL REUS W/ TWL LRG LVL3 (GOWN DISPOSABLE) ×2 IMPLANT
GOWN STRL REUS W/TWL LRG LVL3 (GOWN DISPOSABLE) ×6
HANDLE SUCTION POOLE (INSTRUMENTS) ×1 IMPLANT
HANDLE YANKAUER SUCT BULB TIP (MISCELLANEOUS) ×2 IMPLANT
LIGASURE IMPACT 36 18CM CVD LR (INSTRUMENTS) IMPLANT
MANIFOLD NEPTUNE II (INSTRUMENTS) ×2 IMPLANT
NEEDLE HYPO 22GX1.5 SAFETY (NEEDLE) ×4 IMPLANT
PACK BASIN MAJOR ARMC (MISCELLANEOUS) ×2 IMPLANT
RELOAD PROXIMATE 75MM BLUE (ENDOMECHANICALS) ×8 IMPLANT
RELOAD STAPLE 75 3.8 BLU REG (ENDOMECHANICALS) IMPLANT
SPONGE LAP 18X18 RF (DISPOSABLE) ×2 IMPLANT
SPONGE LAP 18X36 RFD (DISPOSABLE) ×1 IMPLANT
STAPLER PROXIMATE 75MM BLUE (STAPLE) ×1 IMPLANT
STAPLER SKIN PROX 35W (STAPLE) ×2 IMPLANT
SUCTION POOLE HANDLE (INSTRUMENTS) ×2
SUT ETHIBOND 0 MO6 C/R (SUTURE) ×2 IMPLANT
SUT PDS AB 0 CT1 27 (SUTURE) ×6 IMPLANT
SUT SILK 2 0 (SUTURE) ×2
SUT SILK 2 0 SH CR/8 (SUTURE) ×2 IMPLANT
SUT SILK 2 0SH CR/8 30 (SUTURE) ×3 IMPLANT
SUT SILK 2-0 18XBRD TIE 12 (SUTURE) ×1 IMPLANT
SUT VIC AB 0 CT1 36 (SUTURE) ×4 IMPLANT
SUT VIC AB 2-0 SH 27 (SUTURE) ×6
SUT VIC AB 2-0 SH 27XBRD (SUTURE) ×2 IMPLANT
SUT VIC AB 3-0 SH 27 (SUTURE) ×2
SUT VIC AB 3-0 SH 27X BRD (SUTURE) ×1 IMPLANT
SYR 30ML LL (SYRINGE) ×4 IMPLANT
SYR 3ML LL SCALE MARK (SYRINGE) ×2 IMPLANT
TRAY FOLEY MTR SLVR 16FR STAT (SET/KITS/TRAYS/PACK) ×2 IMPLANT

## 2020-07-24 NOTE — H&P (Addendum)
PATIENT NAME: Alan Nelson    MR#:  [id number]  DATE OF BIRTH:  1965/10/19  DATE OF ADMISSION:  07/24/2020  PRIMARY CARE PHYSICIAN: Burnis Medin, [place]   REQUESTING/REFERRING PHYSICIAN: Cheron Schaumann, MD  CHIEF COMPLAINT:   Chief Complaint  Patient presents with  . Seizures  . Hypotension  . Blood In Stools    HISTORY OF PRESENT ILLNESS:  Alan Nelson  is a 55 y.o. Caucasian male with a known history of seizure disorder, hypertension and migraine as well as asthma, BPH, anxiety/depression, polysubstance abuse and lumbar disc disease, who presented to the emergency room with acute onset of seizure followed by postictal phase with confusion.  He was found hypotensive with a blood pressure in the 50s systolic, complaining of abdominal pain mainly in the lower abdomen with associated nausea and vomiting.  He admitted to tactile fever and chills.  He has been having mild headache.  His abdominal pain is started on Thursday and got significantly worse today.  No chest pain or palpitations.  No bleeding diathesis.  Upon presentation to the ER, blood pressure was 77/64 with otherwise normal vital signs.  Later respiratory rate was 22 then 29 and with hydration blood pressure was up to 92/77 and later 126/99.  Labs revealed CMP with hyper kalemia, hyponatremia hypochloremia BUN of 102 and creatinine of 5.25 with a calcium of 7 indirect bili of 1.2 and total bili 1.4, high-sensitivity troponin I 145 lactic acid 4.9 with CBC showing leukocytosis of 11.2 with mild neutrophilia and thrombocytosis with INR 1.3 and PT 15.5.  COVID-19 PCR came back negative.  Portable chest ray showed minor bibasilar atelectasis.  Head CT scan revealed no acute abnormalities.  C-spine CT showed no fracture or subluxation.  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 The abdominal wall defect was seen on the prior exam although no herniation was noted at that time.  Tiny nonobstructing left renal calculi.  Changes of prior splenectomy.  The patient was given IV Zosyn, IV potassium chloride, 3 L bolus of IV normal saline, IV fentanyl and Ativan as well as Zofran followed by infusion of IV normal saline.  Dr. Everlene Farrier was notified about the patient and planned emergent laparotomy.  The patient will be admitted to stepdown unit for further evaluation and management. PAST MEDICAL HISTORY:  Seizure disorder, hypertension and migraine as well as asthma, BPH, anxiety/depression, polysubstance abuse and lumbar disc disease  PAST SURGICAL HISTORY:  Splenectomy and appendectomy.  SOCIAL HISTORY:   Social History   Tobacco Use  . Smoking status: Former Games developer  . Smokeless tobacco: Never Used  Substance Use Topics  . Alcohol use: Yes    Comment: daily    FAMILY HISTORY:  No family history on file.  No pertinent familial disease per his report.  DRUG ALLERGIES:   Allergies  Allergen Reactions  . Bupropion Other (See Comments)    Seizure   . Desipramine Other (See Comments)    Drunk feeling    REVIEW OF SYSTEMS:   ROS As per history of present illness. All pertinent systems were reviewed above. Constitutional, HEENT, cardiovascular, respiratory, GI, GU, musculoskeletal, neuro, psychiatric, endocrine, integumentary and hematologic systems were reviewed and are otherwise negative/unremarkable except for positive findings mentioned above in the HPI.   MEDICATIONS AT HOME:  Prior to Admission medications   Medication Sig Start Date End Date Taking? Authorizing Provider  tamsulosin (FLOMAX) 0.4 MG CAPS capsule Take 0.8 mg by mouth daily. 05/16/20  Yes [provider]  acetaminophen (TYLENOL) 500 MG tablet Take 1 tablet (500 mg total) by mouth every 6 (six) hours as needed for mild pain  or moderate pain. 06/14/20 06/14/21  Phineas Semen, MD  buprenorphine-naloxone (SUBOXONE) 8-2 mg SUBL SL tablet Place 1 tablet under the tongue 2 (two) times daily. 05/18/20   [provider]  ondansetron (ZOFRAN) 4 MG tablet Take 1 tablet (4 mg total) by mouth every 8 (eight) hours as needed. 06/14/20   Phineas Semen, MD  QUEtiapine (SEROQUEL) 100 MG tablet Take 200 mg by mouth at bedtime. 02/08/20   [provider]  rizatriptan (MAXALT) 5 MG tablet Take 5 mg by mouth as needed for migraine. May repeat in 2 hours if needed    [provider]      VITAL SIGNS:  Blood pressure (!) 136/101, pulse 87, temperature 98 F (36.7 C), temperature source Oral, resp. rate (!) 22, height 6' (1.829 m), SpO2 92 %.  PHYSICAL EXAMINATION:  Physical Exam  GENERAL:  55 y.o.-year-old Caucasian male patient lying in the bed with mild distress from pain. EYES: Pupils equal, round, reactive to light and accommodation. No scleral icterus. Extraocular muscles intact.  HEENT: Head atraumatic, normocephalic. Oropharynx and nasopharynx clear.  NECK:  Supple, no jugular venous distention. No thyroid enlargement, no tenderness.  LUNGS: Normal breath sounds bilaterally, no wheezing, rales,rhonchi or crepitation. No use of accessory muscles of respiration.  CARDIOVASCULAR: Regular rate and rhythm, S1, S2 normal. No murmurs, rubs, or gallops.  ABDOMEN: Soft, with mild distention and lower abdominal tenderness mild guarding without rebound tenderness or rigidity.  Bowel sounds present. No organomegaly or mass.  EXTREMITIES: No pedal edema, cyanosis, or clubbing.  NEUROLOGIC: Cranial nerves II through XII are intact. Muscle strength 5/5 in all extremities. Sensation intact. Gait not checked.  PSYCHIATRIC: The patient is alert and oriented x 3.  Normal affect and good eye contact. SKIN: No obvious rash, lesion, or ulcer.   LABORATORY PANEL:   CBC Recent Labs  Lab 07/24/20 1945  WBC  11.2*  HGB 16.9  HCT 47.2  PLT 406*   ------------------------------------------------------------------------------------------------------------------  Chemistries  Recent Labs  Lab 07/24/20 1945  NA 127*  K 2.7*  CL 75*  CO2 22  GLUCOSE 116*  BUN 102*  CREATININE 5.25*  CALCIUM 7.0*  AST 34  ALT 15  ALKPHOS 54  BILITOT 1.4*   ------------------------------------------------------------------------------------------------------------------  Cardiac Enzymes No results for input(s): TROPONINI in the last 168 hours. ------------------------------------------------------------------------------------------------------------------  RADIOLOGY:  CT ABDOMEN PELVIS WO CONTRAST  Result Date: 07/24/2020 CLINICAL DATA:  Abdominal distension and bloody stools EXAM: CT ABDOMEN AND PELVIS WITHOUT CONTRAST TECHNIQUE: Multidetector CT imaging of the abdomen and pelvis was performed following the standard protocol without IV contrast. COMPARISON:  07/10/2009 FINDINGS: Lower chest: No acute abnormality. Hepatobiliary: No focal liver abnormality is seen. No gallstones, gallbladder wall thickening, or biliary dilatation. Pancreas: Unremarkable. No pancreatic ductal dilatation or surrounding inflammatory changes. 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 Adrenals/Urinary Tract: Adrenal glands are within normal limits bilaterally. The kidneys are well visualized bilaterally. Tiny nonobstructing left renal stones are noted. The bladder is decompressed. Stomach/Bowel: Scattered diverticular change of the colon is noted without evidence of diverticulitis. Colon is predominately decompressed. The appendix is unremarkable. Stomach is significantly distended  with fluid and food stuffs. This extends into the jejunum and is secondary to herniation of the proximal jejunum through a defect in the abdominal wall on the left. There is a hernia identified in the left mid  abdomen anteriorly which extends between layers of the abdominal wall musculature. The more distal small bowel is decompressed. Vascular/Lymphatic: Aortic atherosclerosis. No enlarged abdominal or pelvic lymph nodes. Reproductive: Prostate is unremarkable. Other: No abdominal wall hernia or abnormality. No abdominopelvic ascites. Musculoskeletal: Degenerative changes of lumbar spine are noted. 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 The abdominal wall defect was seen on the prior exam although no herniation was noted at that time. Tiny nonobstructing left renal calculi. Changes of prior splenectomy. Electronically Signed   By: Alcide Clever M.D.   On: 07/24/2020 21:45   CT Head Wo Contrast  Result Date: 07/24/2020 CLINICAL DATA:  Seizure, nontraumatic (Age >= 41y) EXAM: CT HEAD WITHOUT CONTRAST TECHNIQUE: Contiguous axial images were obtained from the base of the skull through the vertex without intravenous contrast. COMPARISON:  Head CT 11/13/2019 FINDINGS: Brain: Brain volume is normal for age. No intracranial hemorrhage, mass effect, or midline shift. No hydrocephalus. Incidental mega cisterna magna. The basilar cisterns are patent. No evidence of territorial infarct or acute ischemia. No extra-axial or intracranial fluid collection. Vascular: Atherosclerosis of skullbase vasculature without hyperdense vessel or abnormal calcification. Skull: Bilateral parietal craniotomy.  No fracture or focal lesion. Sinuses/Orbits: Paranasal sinuses and mastoid air cells are clear. The visualized orbits are unremarkable. Multiple dental caries are partially included. Other: None. IMPRESSION: No acute intracranial abnormality or explanation for seizure. Electronically Signed   By: Narda Rutherford M.D.   On: 07/24/2020 21:38   CT Cervical Spine Wo Contrast  Result Date:  07/24/2020 CLINICAL DATA:  Neck trauma, focal neuro deficit or paresthesia (Age 49-64y) EXAM: CT CERVICAL SPINE WITHOUT CONTRAST TECHNIQUE: Multidetector CT imaging of the cervical spine was performed without intravenous contrast. Multiplanar CT image reconstructions were also generated. COMPARISON:  None. FINDINGS: Alignment: Normal. Skull base and vertebrae: No acute fracture. Vertebral body heights are maintained. The dens and skull base are intact. Soft tissues and spinal canal: No prevertebral fluid or swelling. No visible canal hematoma. Disc levels:  Nonacute.  Minor endplate spurring. Upper chest: No acute findings. Other: None. IMPRESSION: No acute fracture or subluxation of the cervical spine. Electronically Signed   By: Narda Rutherford M.D.   On: 07/24/2020 21:40   DG Chest Portable 1 View  Result Date: 07/24/2020 CLINICAL DATA:  Chest pain. EXAM: PORTABLE CHEST 1 VIEW COMPARISON:  Radiograph 11/13/2019 FINDINGS: The cardiomediastinal contours are normal. Minor bibasilar atelectasis. Pulmonary vasculature is normal. No consolidation, pleural effusion, or pneumothorax. Remote left rib fractures. No acute osseous abnormalities are seen. IMPRESSION: Minor bibasilar atelectasis. Electronically Signed   By: Narda Rutherford M.D.   On: 07/24/2020 20:01      IMPRESSION AND PLAN:   1.  Incarcerated bowel loop with subsequent small bowel obstruction likely with enteritis and subsequent severe sepsis. -The patient is undergoing laparotomy will be admitted to a stepdown unit. -Pain management will be provided. -He will be kept n.p.o. -He will be hydrated with IV normal saline. -General surgery and Dr. Gershon Mussel will be following with [place]. -We will continue the patient on IV Zosyn.  2.  Seizure disorder. -EEG and place the patient on as needed IV  Ativan. -Neurology consultation will be obtained.  3.  Acute kidney injury with associated hypokalemia, hyponatremia and hypochloremia.  This is likely  secondary to hypovolemia, nausea and vomiting and dehydration. -The patient will be hydrated with IV normal saline with added potassium chloride. -We will follow his BMP. -Magnesium level came back 2.6. -Nephrotoxins will be avoided. -Renal ultrasound will be obtained. -Nephrology consult to be obtained. -I notified Dr. Thedore Mins about patient.  4.  Anxiety and depression. -We will continue Seroquel.  5.  History of migraine. -Maxalt will be resumed.  6.  BPH. -We will continue Flomax.  7.  History of opioid abuse. -We will continue his Suboxone.  8.  DVT prophylaxis. -Subtenons Lovenox postoperatively.    All the records are reviewed and case discussed with ED provider. The plan of care was discussed in details with the patient (and family). I answered all questions. The patient agreed to proceed with the above mentioned plan. Further management will depend upon hospital course.   CODE STATUS: Full code  Status is: Inpatient  Remains inpatient appropriate because:Ongoing diagnostic testing needed not appropriate for outpatient work up, Unsafe d/c plan, IV treatments appropriate due to intensity of illness or inability to take PO and Inpatient level of care appropriate due to severity of illness   Dispo: The patient is from: Home              Anticipated d/c is to: Home              Anticipated d/c date is: 3 days              Patient currently is not medically stable to d/c.   Difficult to place patient No   TOTAL TIME TAKING CARE OF THIS PATIENT: 60 minutes.    Hannah Beat M.D on 07/24/2020 at 10:23 PM  [facility]   From 7 PM-7 AM, contact night-coverage [email or web address]  CC: Primary care physician; [place], [place], PA-C

## 2020-07-24 NOTE — ED Triage Notes (Signed)
Pt presents to the [facility] via EMS from [facility] with c/o seizure, hypotension, and blood in stool. Pt had witness seizure like activity by jail staff that describe as full body jerking but unknown duration. Denies hx of seizures. Pt also reports having several episodes of blood in stool over the past couple days. Pt noted to be hypotension en route with EMS and upon arrival to ER.
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
## 2020-07-24 NOTE — ED Notes (Signed)
Consent obtained from patient and placed on pt's chart.  

## 2020-07-24 NOTE — ED Provider Notes (Signed)
74  [facility] Emergency Department Provider Note  ____________________________________________   Event Date/Time   First MD Initiated Contact with Patient 07/24/20 1931     (approximate)  I have reviewed the triage vital signs and the nursing notes.   HISTORY  Chief Complaint Seizures, Hypotension, and Blood In Stools    HPI Alan Nelson is a 54 y.o. male otherwise healthy other than being on medication for GERD who comes in from jail for seizure-like activity.  Patient had witnessed seizure-like activity.  Unclear how long it went on for but patient was postictal afterwards.  Patient reporting some abdominal pain but appears confused not able to give me a full history.  There is also report of him having a few days of melena.  Unable to get full HPI due to patient's altered mental status  Past medical history: GERD Patient Active Problem List   Diagnosis Date Noted  . Bowel obstruction (HCC) 07/24/2020   Past surgical history: Ruptured appendicitis  Prior to Admission medications   Medication Sig Start Date End Date Taking? Authorizing Provider  acetaminophen (TYLENOL) 500 MG tablet Take 1 tablet (500 mg total) by mouth every 6 (six) hours as needed for mild pain or moderate pain. 06/14/20 06/14/21  Phineas Semen, MD  Buprenorphine HCl-Naloxone HCl (SUBOXONE) 8-2 MG FILM Place 16 mg of opioid under the tongue daily. 02/22/18   [provider]  DULoxetine (CYMBALTA) 60 MG capsule Take 2 capsules (120 mg total) by mouth daily. 07/06/18   Cottle, Steva Ready., MD  lamoTRIgine (LAMICTAL) 100 MG tablet Take 2 tablets (200 mg total) by mouth daily. 07/08/18   Cottle, Steva Ready., MD  ondansetron (ZOFRAN) 4 MG tablet Take 1 tablet (4 mg total) by mouth every 8 (eight) hours as needed. 06/14/20   Phineas Semen, MD  rizatriptan (MAXALT) 5 MG tablet Take 5 mg by mouth as needed for migraine. May repeat in 2 hours if needed    [provider]     Allergies Bupropion and Desipramine  No family history on file.  Social History Social History   Tobacco Use  . Smoking status: Former Games developer  . Smokeless tobacco: Never Used  Substance Use Topics  . Alcohol use: Yes    Comment: daily  . Drug use: Yes    Types: Cocaine      Review of Systems Constitutional: No fever/chills Eyes: No visual changes. ENT: No sore throat. Cardiovascular: Denies chest pain. Respiratory: Denies shortness of breath. Gastrointestinal: Nausea abdominal pain Genitourinary: Negative for dysuria. Musculoskeletal: Negative for back pain. Skin: Negative for rash. Neurological: Negative for headaches, focal weakness or numbness.  Seizure-like activity confusion All other ROS negative ____________________________________________   PHYSICAL EXAM:  VITAL SIGNS: Blood pressure 114/83, pulse 86, temperature 98 F (36.7 C), temperature source Oral, resp. rate (!) 23, height 6' (1.829 m), SpO2 100 %.   Constitutional: Altered moaning Eyes: Conjunctivae are normal.  Pupils equal size Head: Atraumatic. Nose: No congestion/rhinnorhea. Mouth/Throat: Mucous membranes are moist.   Neck: No stridor. Trachea Midline. FROM Cardiovascular: Normal rate, regular rhythm. Grossly normal heart sounds.  Good peripheral circulation. Respiratory: Normal respiratory effort.  No retractions. Lungs CTAB. Gastrointestinal: Old scar, tender to palpation no distention. No abdominal bruits.  Musculoskeletal: No lower extremity tenderness nor edema.  No joint effusions. Neurologic: Patient is altered and confused, moving all extremities Skin:  Skin is warm, dry and intact. No rash noted. Psychiatric: Unable to fully assess due to altered mental  status GU: Deferred   ____________________________________________   LABS (all labs ordered are listed, but only abnormal results are displayed)  Labs Reviewed  CBC WITH DIFFERENTIAL/PLATELET - Abnormal; Notable for the  following components:      Result Value   WBC 11.2 (*)    Platelets 406 (*)    Neutro Abs 9.3 (*)    All other components within normal limits  PROTIME-INR - Abnormal; Notable for the following components:   Prothrombin Time 15.5 (*)    INR 1.3 (*)    All other components within normal limits  LACTIC ACID, PLASMA - Abnormal; Notable for the following components:   Lactic Acid, Venous 4.9 (*)    All other components within normal limits  BASIC METABOLIC PANEL - Abnormal; Notable for the following components:   Sodium 127 (*)    Potassium 2.7 (*)    Chloride 75 (*)    Glucose, Bld 116 (*)    BUN 102 (*)    Creatinine, Ser 5.25 (*)    Calcium 7.0 (*)    GFR, Estimated 12 (*)    Anion gap 30 (*)    All other components within normal limits  HEPATIC FUNCTION PANEL - Abnormal; Notable for the following components:   Total Bilirubin 1.4 (*)    Indirect Bilirubin 1.2 (*)    All other components within normal limits  CBG MONITORING, ED - Abnormal; Notable for the following components:   Glucose-Capillary 115 (*)    All other components within normal limits  TROPONIN I (HIGH SENSITIVITY) - Abnormal; Notable for the following components:   Troponin I (High Sensitivity) 145 (*)    All other components within normal limits  SARS CORONAVIRUS 2 BY RT PCR (HOSPITAL ORDER, PERFORMED IN [facility])  APTT  LACTIC ACID, PLASMA  MAGNESIUM  BLOOD GAS, VENOUS  BASIC METABOLIC PANEL  TYPE AND SCREEN  TROPONIN I (HIGH SENSITIVITY)   ____________________________________________   ED ECG REPORT I, Concha Se, the attending physician, personally viewed and interpreted this ECG.  Normal sinus no ST elevation, no T wave inversions, QTC elevated at 499 ____________________________________________  RADIOLOGY I, Concha Se, personally viewed and evaluated these images (plain radiographs) as part of my medical decision making, as well as reviewing the written report by the  radiologist.  ED MD interpretation: No pneumonia  Official radiology report(s): CT ABDOMEN PELVIS WO CONTRAST  Result Date: 07/24/2020 CLINICAL DATA:  Abdominal distension and bloody stools EXAM: CT ABDOMEN AND PELVIS WITHOUT CONTRAST TECHNIQUE: Multidetector CT imaging of the abdomen and pelvis was performed following the standard protocol without IV contrast. COMPARISON:  07/10/2009 FINDINGS: Lower chest: No acute abnormality. Hepatobiliary: No focal liver abnormality is seen. No gallstones, gallbladder wall thickening, or biliary dilatation. Pancreas: Unremarkable. No pancreatic ductal dilatation or surrounding inflammatory changes. 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 Adrenals/Urinary Tract: Adrenal glands are within normal limits bilaterally. The kidneys are well visualized bilaterally. Tiny nonobstructing left renal stones are noted. The bladder is decompressed. Stomach/Bowel: Scattered diverticular change of the colon is noted without evidence of diverticulitis. Colon is predominately decompressed. The appendix is unremarkable. Stomach is significantly distended with fluid and food stuffs. This extends into the jejunum and is secondary to herniation of the proximal jejunum through a defect in the abdominal wall on the left. There is a hernia identified in the left mid abdomen anteriorly which extends between layers of the abdominal wall musculature. The more distal  small bowel is decompressed. Vascular/Lymphatic: Aortic atherosclerosis. No enlarged abdominal or pelvic lymph nodes. Reproductive: Prostate is unremarkable. Other: No abdominal wall hernia or abnormality. No abdominopelvic ascites. Musculoskeletal: Degenerative changes of lumbar spine are noted. 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 The abdominal  wall defect was seen on the prior exam although no herniation was noted at that time. Tiny nonobstructing left renal calculi. Changes of prior splenectomy. Electronically Signed   By: Alcide Clever M.D.   On: 07/24/2020 21:45   CT Head Wo Contrast  Result Date: 07/24/2020 CLINICAL DATA:  Seizure, nontraumatic (Age >= 41y) EXAM: CT HEAD WITHOUT CONTRAST TECHNIQUE: Contiguous axial images were obtained from the base of the skull through the vertex without intravenous contrast. COMPARISON:  Head CT 11/13/2019 FINDINGS: Brain: Brain volume is normal for age. No intracranial hemorrhage, mass effect, or midline shift. No hydrocephalus. Incidental mega cisterna magna. The basilar cisterns are patent. No evidence of territorial infarct or acute ischemia. No extra-axial or intracranial fluid collection. Vascular: Atherosclerosis of skullbase vasculature without hyperdense vessel or abnormal calcification. Skull: Bilateral parietal craniotomy.  No fracture or focal lesion. Sinuses/Orbits: Paranasal sinuses and mastoid air cells are clear. The visualized orbits are unremarkable. Multiple dental caries are partially included. Other: None. IMPRESSION: No acute intracranial abnormality or explanation for seizure. Electronically Signed   By: Narda Rutherford M.D.   On: 07/24/2020 21:38   CT Cervical Spine Wo Contrast  Result Date: 07/24/2020 CLINICAL DATA:  Neck trauma, focal neuro deficit or paresthesia (Age 70-64y) EXAM: CT CERVICAL SPINE WITHOUT CONTRAST TECHNIQUE: Multidetector CT imaging of the cervical spine was performed without intravenous contrast. Multiplanar CT image reconstructions were also generated. COMPARISON:  None. FINDINGS: Alignment: Normal. Skull base and vertebrae: No acute fracture. Vertebral body heights are maintained. The dens and skull base are intact. Soft tissues and spinal canal: No prevertebral fluid or swelling. No visible canal hematoma. Disc levels:  Nonacute.  Minor endplate spurring. Upper  chest: No acute findings. Other: None. IMPRESSION: No acute fracture or subluxation of the cervical spine. Electronically Signed   By: Narda Rutherford M.D.   On: 07/24/2020 21:40   DG Chest Portable 1 View  Result Date: 07/24/2020 CLINICAL DATA:  Chest pain. EXAM: PORTABLE CHEST 1 VIEW COMPARISON:  Radiograph 11/13/2019 FINDINGS: The cardiomediastinal contours are normal. Minor bibasilar atelectasis. Pulmonary vasculature is normal. No consolidation, pleural effusion, or pneumothorax. Remote left rib fractures. No acute osseous abnormalities are seen. IMPRESSION: Minor bibasilar atelectasis. Electronically Signed   By: Narda Rutherford M.D.   On: 07/24/2020 20:01    ____________________________________________   PROCEDURES  Procedure(s) performed (including Critical Care):  .Critical Care Performed by: Concha Se, MD Authorized by: Concha Se, MD   Critical care provider statement:    Critical care time (minutes):  45   Critical care was necessary to treat or prevent imminent or life-threatening deterioration of the following conditions: Renal failure, incarcerated hernia.   Critical care was time spent personally by me on the following activities:  Discussions with consultants, evaluation of patient's response to treatment, examination of patient, ordering and performing treatments and interventions, ordering and review of laboratory studies, ordering and review of radiographic studies, pulse oximetry, re-evaluation of patient's condition, obtaining history from patient or surrogate and review of old charts .1-3 Lead EKG Interpretation Performed by: Concha Se, MD Authorized by: Artis Delay  E, MD     Interpretation: normal     ECG rate:  90s    ECG rate assessment: normal     Rhythm: sinus rhythm     Ectopy: none     Conduction: normal       ____________________________________________   INITIAL IMPRESSION / ASSESSMENT AND PLAN / ED COURSE  Alan Nelson was  evaluated in Emergency Department on 07/24/2020 for the symptoms described in the history of present illness. He was evaluated in the context of the global COVID-19 pandemic, which necessitated consideration that the patient might be at risk for infection with the SARS-CoV-2 virus that causes COVID-19. Institutional protocols and algorithms that pertain to the evaluation of patients at risk for COVID-19 are in a state of rapid change based on information released by regulatory bodies including the CDC and federal and state organizations. These policies and algorithms were followed during the patient's care in the ED.    Patient comes in with altered mental status secondary to seizure.  Patient could just be post ictal patient was notably hypotensive in the 50s systolic.  We will begin fluid resuscitation to see if that improves mental status.  Will get CT head to evaluate for possible cause of seizure including mass, bleed.  Patient is reporting some abdominal pain he seems tender when I push on his abdomen.  We will also get CT abdomen to rule out any kind of infection such as SBO, diverticulitis, perforation, hernia.  Will get labs to evaluate for Electra abnormalities, AKI  7:45 PM patient is gotten about 500 cc of fluid and GCS is getting better.  He is now alert and oriented x3 and able to follow commands.  Patient is on 2 L of fluids and blood pressures have normalized  There is no evidence of anemia patient had no melena on exam.  Troponin was 145 which at this time I suspect is more likely demand  His lactate was significantly elevated at 4.9 I suspect this is from severe dehydration.  His kidney function is significantly elevated at 5.25 with a BUN of 102 and a anion gap of 30.  His potassium is 2.7.  He says sodium of 127 given abnormalities we'll keep patient on the cardiac monitor  We will add on VBG  CT scan concerning for incarcerated hernia  Discussed with Dr. Everlene Farrier will go to OR but  given his seizure and renal failure recommend admission to medicine.      ____________________________________________   FINAL CLINICAL IMPRESSION(S) / ED DIAGNOSES   Final diagnoses:  Acute renal failure, unspecified acute renal failure type (HCC)  Non-recurrent abdominal hernia with obstruction without gangrene, unspecified hernia type  Hypotension, unspecified hypotension type      MEDICATIONS GIVEN DURING THIS VISIT:  Medications  LORazepam (ATIVAN) injection 1 mg (has no administration in time range)  0.9 %  sodium chloride infusion (has no administration in time range)  sodium chloride 0.9 % bolus 1,000 mL (0 mLs Intravenous Stopped 07/24/20 2000)  sodium chloride 0.9 % bolus 1,000 mL (0 mLs Intravenous Stopped 07/24/20 2130)  ondansetron (ZOFRAN) injection 4 mg (4 mg Intravenous Given 07/24/20 2025)  LORazepam (ATIVAN) injection 0.5 mg (0.5 mg Intravenous Given 07/24/20 2109)  potassium chloride 10 mEq in 100 mL IVPB (0 mEq Intravenous Stopped 07/24/20 2210)  piperacillin-tazobactam (ZOSYN) IVPB 3.375 g (0 g Intravenous Stopped 07/24/20 2254)  sodium chloride 0.9 % bolus 1,000 mL (1,000 mLs Intravenous New Bag/Given 07/24/20 2224)  ED Discharge Orders    None       Note:  This document was prepared using Dragon voice recognition software and may include unintentional dictation errors.   Concha SeFunke, Yarielis Funaro E, MD 07/24/20 [phone]

## 2020-07-24 NOTE — Anesthesia Preprocedure Evaluation (Signed)
Anesthesia Evaluation  Patient identified by MRN, date of birth, ID band Patient awake    Reviewed: Allergy & Precautions, H&P , NPO status , Patient's Chart, lab work & pertinent test results, reviewed documented beta blocker date and time   History of Anesthesia Complications Negative for: history of anesthetic complications  Airway Mallampati: I  TM Distance: >3 FB Neck ROM: full    Dental  (+) Dental Advidsory Given, Poor Dentition, Chipped, Missing Horrible dentition.  Denies anything loose at this time:   Pulmonary neg pulmonary ROS, former smoker,    Pulmonary exam normal breath sounds clear to auscultation       Cardiovascular Exercise Tolerance: Good negative cardio ROS Normal cardiovascular exam Rhythm:regular Rate:Normal     Neuro/Psych Seizures -, Well Controlled,  negative psych ROS   GI/Hepatic GERD  ,(+)     substance abuse (last use december 23rd)  cocaine use,   Endo/Other  negative endocrine ROS  Renal/GU ARFRenal disease  negative genitourinary   Musculoskeletal   Abdominal   Peds  Hematology negative hematology ROS (+)   Anesthesia Other Findings History reviewed. No pertinent past medical history.  Pt is a 55 yo M with incarcerated hernia seen on CT scan with a K of 2.7, elevated lactic acid, and a rising creatinine.  Dr. Everlene Farrier will be taking him emergently to the OR for surgical repair.  Reproductive/Obstetrics negative OB ROS                             Anesthesia Physical Anesthesia Plan  ASA: IV and emergent  Anesthesia Plan: General and Rapid Sequence   Post-op Pain Management:    Induction: Intravenous, Rapid sequence and Cricoid pressure planned  PONV Risk Score and Plan: 2 and Ondansetron, Dexamethasone and Treatment may vary due to age or medical condition  Airway Management Planned: Oral ETT  Additional Equipment:   Intra-op Plan:    Post-operative Plan: Extubation in OR and Possible Post-op intubation/ventilation  Informed Consent: I have reviewed the patients History and Physical, chart, labs and discussed the procedure including the risks, benefits and alternatives for the proposed anesthesia with the patient or authorized representative who has indicated his/her understanding and acceptance.     Dental Advisory Given  Plan Discussed with: Anesthesiologist, CRNA and Surgeon  Anesthesia Plan Comments:         Anesthesia Quick Evaluation

## 2020-07-24 NOTE — ED Notes (Signed)
Patient transported to CT 

## 2020-07-24 NOTE — Anesthesia Procedure Notes (Signed)
Procedure Name: Intubation Date/Time: 07/24/2020 11:38 PM Performed by: Waldo Laine, CRNA Pre-anesthesia Checklist: Patient identified, Patient being monitored, Timeout performed, Emergency Drugs available and Suction available Patient Re-evaluated:Patient Re-evaluated prior to induction Oxygen Delivery Method: Circle system utilized Preoxygenation: Pre-oxygenation with 100% oxygen Induction Type: IV induction, Rapid sequence and Cricoid Pressure applied Laryngoscope Size: 3 and McGraph Grade View: Grade I Tube type: Oral Tube size: 7.5 mm Number of attempts: 1 Airway Equipment and Method: Stylet Placement Confirmation: ETT inserted through vocal cords under direct vision,  positive ETCO2 and breath sounds checked- equal and bilateral Secured at: 21 cm Tube secured with: Tape Dental Injury: Teeth and Oropharynx as per pre-operative assessment

## 2020-07-25 ENCOUNTER — Encounter: Payer: Self-pay | Admitting: Surgery

## 2020-07-25 ENCOUNTER — Inpatient Hospital Stay

## 2020-07-25 DIAGNOSIS — K430 Incisional hernia with obstruction, without gangrene: Secondary | ICD-10-CM

## 2020-07-25 DIAGNOSIS — K436 Other and unspecified ventral hernia with obstruction, without gangrene: Secondary | ICD-10-CM

## 2020-07-25 DIAGNOSIS — K56601 Complete intestinal obstruction, unspecified as to cause: Secondary | ICD-10-CM | POA: Diagnosis not present

## 2020-07-25 DIAGNOSIS — R569 Unspecified convulsions: Secondary | ICD-10-CM | POA: Diagnosis not present

## 2020-07-25 LAB — MRSA PCR SCREENING: MRSA by PCR: NEGATIVE

## 2020-07-25 LAB — CBC WITH DIFFERENTIAL/PLATELET
Abs Immature Granulocytes: 0.03 10*3/uL (ref 0.00–0.07)
Basophils Absolute: 0 10*3/uL (ref 0.0–0.1)
Basophils Relative: 0 %
Eosinophils Absolute: 0 10*3/uL (ref 0.0–0.5)
Eosinophils Relative: 0 %
HCT: 39.5 % (ref 39.0–52.0)
Hemoglobin: 14.5 g/dL (ref 13.0–17.0)
Immature Granulocytes: 0 %
Lymphocytes Relative: 10 %
Lymphs Abs: 1.1 10*3/uL (ref 0.7–4.0)
MCH: 32.8 pg (ref 26.0–34.0)
MCHC: 36.7 g/dL — ABNORMAL HIGH (ref 30.0–36.0)
MCV: 89.4 fL (ref 80.0–100.0)
Monocytes Absolute: 0.8 10*3/uL (ref 0.1–1.0)
Monocytes Relative: 7 %
Neutro Abs: 9.9 10*3/uL — ABNORMAL HIGH (ref 1.7–7.7)
Neutrophils Relative %: 83 %
Platelets: 365 10*3/uL (ref 150–400)
RBC: 4.42 MIL/uL (ref 4.22–5.81)
RDW: 12.1 % (ref 11.5–15.5)
WBC: 11.8 10*3/uL — ABNORMAL HIGH (ref 4.0–10.5)
nRBC: 0 % (ref 0.0–0.2)

## 2020-07-25 LAB — PROCALCITONIN: Procalcitonin: 6.59 ng/mL

## 2020-07-25 LAB — COMPREHENSIVE METABOLIC PANEL
ALT: 13 U/L (ref 0–44)
AST: 25 U/L (ref 15–41)
Albumin: 3.7 g/dL (ref 3.5–5.0)
Alkaline Phosphatase: 46 U/L (ref 38–126)
Anion gap: 21 — ABNORMAL HIGH (ref 5–15)
BUN: 112 mg/dL — ABNORMAL HIGH (ref 6–20)
CO2: 28 mmol/L (ref 22–32)
Calcium: 7.5 mg/dL — ABNORMAL LOW (ref 8.9–10.3)
Chloride: 76 mmol/L — ABNORMAL LOW (ref 98–111)
Creatinine, Ser: 5.61 mg/dL — ABNORMAL HIGH (ref 0.61–1.24)
GFR, Estimated: 11 mL/min — ABNORMAL LOW (ref >60)
Glucose, Bld: 87 mg/dL (ref 70–99)
Potassium: 3.5 mmol/L (ref 3.5–5.1)
Sodium: 125 mmol/L — ABNORMAL LOW (ref 135–145)
Total Bilirubin: 1.7 mg/dL — ABNORMAL HIGH (ref 0.3–1.2)
Total Protein: 6.7 g/dL (ref 6.5–8.1)

## 2020-07-25 LAB — APTT: aPTT: 28 seconds (ref 24–36)

## 2020-07-25 LAB — LACTIC ACID, PLASMA
Lactic Acid, Venous: 1.5 mmol/L (ref 0.5–1.9)
Lactic Acid, Venous: 1.3 mmol/L (ref 0.5–1.9)

## 2020-07-25 LAB — PROTIME-INR
INR: 1.4 — ABNORMAL HIGH (ref 0.8–1.2)
Prothrombin Time: 16.5 seconds — ABNORMAL HIGH (ref 11.4–15.2)

## 2020-07-25 LAB — HIV ANTIBODY (ROUTINE TESTING W REFLEX): HIV Screen 4th Generation wRfx: NONREACTIVE

## 2020-07-25 LAB — GLUCOSE, CAPILLARY: Glucose-Capillary: 92 mg/dL (ref 70–99)

## 2020-07-25 LAB — CORTISOL: Cortisol, Plasma: 83.5 ug/dL

## 2020-07-25 MED ORDER — ONDANSETRON HCL 4 MG/2ML IJ SOLN
4.0000 mg | Freq: Four times a day (QID) | INTRAMUSCULAR | Status: DC | PRN
  Administered 2020-07-25 – 2020-07-27 (×2): 4 mg via INTRAVENOUS
  Filled 2020-07-25 (×2): qty 2

## 2020-07-25 MED ORDER — ACETAMINOPHEN 325 MG PO TABS: 650.0000 mg | ORAL_TABLET | Freq: Four times a day (QID) | ORAL | Status: DC | PRN

## 2020-07-25 MED ORDER — SUGAMMADEX SODIUM 200 MG/2ML IV SOLN
INTRAVENOUS | Status: DC | PRN
  Administered 2020-07-25: 200 mg via INTRAVENOUS

## 2020-07-25 MED ORDER — ALBUMIN HUMAN 5 % IV SOLN
INTRAVENOUS | Status: AC
  Filled 2020-07-25: qty 250

## 2020-07-25 MED ORDER — LACTATED RINGERS IV SOLN: INTRAVENOUS | Status: DC | PRN

## 2020-07-25 MED ORDER — HEPARIN SODIUM (PORCINE) 5000 UNIT/ML IJ SOLN
5000.0000 [IU] | Freq: Three times a day (TID) | INTRAMUSCULAR | Status: DC
  Administered 2020-07-25 – 2020-07-29 (×10): 5000 [IU] via SUBCUTANEOUS
  Filled 2020-07-25 (×11): qty 1

## 2020-07-25 MED ORDER — ALBUMIN HUMAN 5 % IV SOLN: INTRAVENOUS | Status: DC | PRN

## 2020-07-25 MED ORDER — TAMSULOSIN HCL 0.4 MG PO CAPS
0.8000 mg | ORAL_CAPSULE | Freq: Every day | ORAL | Status: DC
  Administered 2020-07-25 – 2020-07-30 (×5): 0.8 mg via ORAL
  Filled 2020-07-25 (×5): qty 2

## 2020-07-25 MED ORDER — ACETAMINOPHEN 650 MG RE SUPP: 650.0000 mg | Freq: Four times a day (QID) | RECTAL | Status: DC | PRN

## 2020-07-25 MED ORDER — FENTANYL CITRATE (PF) 100 MCG/2ML IJ SOLN
12.5000 ug | INTRAMUSCULAR | Status: DC | PRN
  Administered 2020-07-25 (×4): 25 ug via INTRAVENOUS

## 2020-07-25 MED ORDER — VASOPRESSIN 20 UNIT/ML IV SOLN
INTRAVENOUS | Status: AC
  Filled 2020-07-25: qty 1

## 2020-07-25 MED ORDER — ONDANSETRON HCL 4 MG PO TABS: 4.0000 mg | ORAL_TABLET | Freq: Three times a day (TID) | ORAL | Status: DC | PRN

## 2020-07-25 MED ORDER — QUETIAPINE FUMARATE 25 MG PO TABS: 200.0000 mg | ORAL_TABLET | Freq: Every day | ORAL | Status: DC

## 2020-07-25 MED ORDER — SODIUM CHLORIDE 0.9 % IV SOLN: INTRAVENOUS | Status: DC

## 2020-07-25 MED ORDER — METRONIDAZOLE IN NACL 5-0.79 MG/ML-% IV SOLN
500.0000 mg | Freq: Three times a day (TID) | INTRAVENOUS | Status: DC
  Administered 2020-07-25: 500 mg via INTRAVENOUS
  Filled 2020-07-25 (×3): qty 100

## 2020-07-25 MED ORDER — FENTANYL CITRATE (PF) 100 MCG/2ML IJ SOLN
INTRAMUSCULAR | Status: AC
  Administered 2020-07-25: 25 ug via INTRAVENOUS
  Filled 2020-07-25: qty 2

## 2020-07-25 MED ORDER — POTASSIUM CHLORIDE IN NACL 40-0.9 MEQ/L-% IV SOLN
INTRAVENOUS | Status: DC
  Filled 2020-07-25 (×4): qty 1000

## 2020-07-25 MED ORDER — CHLORHEXIDINE GLUCONATE CLOTH 2 % EX PADS
6.0000 | MEDICATED_PAD | Freq: Every day | CUTANEOUS | Status: DC
  Administered 2020-07-25 – 2020-07-27 (×3): 6 via TOPICAL

## 2020-07-25 MED ORDER — FENTANYL CITRATE (PF) 100 MCG/2ML IJ SOLN
50.0000 ug | Freq: Once | INTRAMUSCULAR | Status: AC
Start: 2020-07-25 — End: 2020-07-25
  Administered 2020-07-25: 50 ug via INTRAVENOUS

## 2020-07-25 MED ORDER — ACETAMINOPHEN 10 MG/ML IV SOLN
1000.0000 mg | Freq: Four times a day (QID) | INTRAVENOUS | Status: AC
  Administered 2020-07-25 (×4): 1000 mg via INTRAVENOUS
  Filled 2020-07-25 (×5): qty 100

## 2020-07-25 MED ORDER — PIPERACILLIN-TAZOBACTAM 3.375 G IVPB
3.3750 g | Freq: Two times a day (BID) | INTRAVENOUS | Status: DC
  Administered 2020-07-25: 3.375 g via INTRAVENOUS
  Filled 2020-07-25: qty 50

## 2020-07-25 MED ORDER — ROCURONIUM BROMIDE 100 MG/10ML IV SOLN
INTRAVENOUS | Status: DC | PRN
  Administered 2020-07-24: 40 mg via INTRAVENOUS

## 2020-07-25 MED ORDER — ONDANSETRON HCL 4 MG PO TABS: 4.0000 mg | ORAL_TABLET | Freq: Four times a day (QID) | ORAL | Status: DC | PRN

## 2020-07-25 MED ORDER — SODIUM CHLORIDE 0.9 % IV SOLN
INTRAVENOUS | Status: DC | PRN
  Administered 2020-07-25: 50 mL

## 2020-07-25 MED ORDER — SUMATRIPTAN SUCCINATE 50 MG PO TABS
50.0000 mg | ORAL_TABLET | ORAL | Status: DC | PRN
  Filled 2020-07-25: qty 1

## 2020-07-25 MED ORDER — ALBUMIN HUMAN 25 % IV SOLN
25.0000 g | Freq: Once | INTRAVENOUS | Status: AC
  Administered 2020-07-25: 25 g via INTRAVENOUS
  Filled 2020-07-25: qty 100

## 2020-07-25 MED ORDER — PIPERACILLIN-TAZOBACTAM IN DEX 2-0.25 GM/50ML IV SOLN
2.2500 g | Freq: Three times a day (TID) | INTRAVENOUS | Status: DC
  Administered 2020-07-25 – 2020-07-27 (×5): 2.25 g via INTRAVENOUS
  Filled 2020-07-25 (×13): qty 50

## 2020-07-25 MED ORDER — BUPRENORPHINE HCL-NALOXONE HCL 8-2 MG SL SUBL
1.0000 | SUBLINGUAL_TABLET | Freq: Two times a day (BID) | SUBLINGUAL | Status: DC
  Administered 2020-07-25: 1 via SUBLINGUAL
  Filled 2020-07-25: qty 1

## 2020-07-25 MED ORDER — TRAZODONE HCL 50 MG PO TABS: 25.0000 mg | ORAL_TABLET | Freq: Every evening | ORAL | Status: DC | PRN

## 2020-07-25 MED ORDER — ACETAMINOPHEN 10 MG/ML IV SOLN
INTRAVENOUS | Status: DC | PRN
  Administered 2020-07-25: 1000 mg via INTRAVENOUS

## 2020-07-25 MED ORDER — SODIUM CHLORIDE 0.9 % IV SOLN
INTRAVENOUS | Status: DC | PRN
  Administered 2020-07-24: 30 ug/min via INTRAVENOUS

## 2020-07-25 MED ORDER — SODIUM CHLORIDE 0.9 % IV BOLUS: 1000.0000 mL | Freq: Once | INTRAVENOUS | Status: DC

## 2020-07-25 MED ORDER — ACETAMINOPHEN 10 MG/ML IV SOLN
INTRAVENOUS | Status: AC
  Filled 2020-07-25: qty 100

## 2020-07-25 NOTE — Procedures (Signed)
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 EEG to evaluate for seizure  Level of alertness: Awake,  asleep  AEDs during EEG study: None  Technical aspects: This EEG study was done with scalp electrodes positioned according to the 10-20 International system of electrode placement. Electrical activity was acquired at a sampling rate of 500Hz  and reviewed with a high frequency filter of 70Hz  and a low frequency filter of 1Hz . EEG data were recorded continuously and digitally stored.   Description: The posterior dominant rhythm consists of 8 Hz activity of moderate voltage (25-35 uV) seen predominantly in posterior head regions, symmetric and reactive to eye opening and eye closing. Sleep was characterized by vertex waves, sleep spindles (12 to 14 Hz), maximal frontocentral region. Hyperventilation and photic stimulation were not performed.     IMPRESSION: This study is within normal limits. No seizures or epileptiform discharges were seen throughout the recording. 

## 2020-07-25 NOTE — Consult Note (Signed)
[address] [place], [place] [address] Phone [phone]. Fax [phone]  Date: 07/25/2020                  Patient Name:  Alan Nelson  MRN: [id number]  DOB: [birth year]  Age / Sex: 55 y.o., male         PCP: Piedad Climes, [place], PA-C                 Service Requesting Consult: IM/ Ashok Pall Wilfred Curtis, MD                 Reason for Consult: ARF            History of Present Illness: Patient is a 55 y.o. 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 tamsulosin (FLOMAX) 0.4 MG CAPS capsule Take 0.8 mg by mouth daily.   Past Week at Unknown time  . acetaminophen (TYLENOL) 500 MG tablet Take 1 tablet (500 mg total) by mouth every 6 (six) hours as needed for mild pain or moderate pain. 100 tablet 2 unknown at prn  . buprenorphine-naloxone (SUBOXONE) 8-2 mg SUBL SL tablet Place 1 tablet under the tongue 2 (two) times daily.   30+ days  . ondansetron (ZOFRAN) 4 MG tablet Take 1 tablet (4 mg total) by mouth every 8 (eight) hours as needed. 20 tablet 0 unknown at prn  . QUEtiapine (SEROQUEL) 100 MG tablet Take 200 mg by mouth at bedtime.   30+ days  . rizatriptan (MAXALT) 5 MG tablet Take 5 mg by mouth as needed for migraine. May repeat in 2 hours if needed   unknown at prn    Current medications: Current Facility-Administered Medications  Medication Dose Route Frequency Provider Last Rate Last Admin  . 0.9 %  sodium chloride infusion   Intravenous Continuous Wouk, Wilfred Curtis, MD      . acetaminophen (OFIRMEV) IV 1,000 mg  1,000 mg Intravenous Q6H Pabon, [place] F, MD  400 mL/hr at 07/25/20 0849 1,000 mg at 07/25/20 0849  . Chlorhexidine Gluconate Cloth 2 % PADS 6 each  6 each Topical Q0600 Mansy, Vernetta Honey, MD   6 each at 07/25/20 0425  . fentaNYL (SUBLIMAZE) injection 12.5-50 mcg  12.5-50 mcg Intravenous Q2H PRN Sterling Big F, MD   25 mcg at 07/25/20 0205  . heparin injection 5,000 Units  5,000 Units Subcutaneous Q8H Leafy Ro, MD   5,000 Units at 07/25/20 0518  . LORazepam (ATIVAN) injection 1 mg  1 mg Intravenous Q1H PRN Pabon, Diego F, MD      . ondansetron (ZOFRAN) tablet 4 mg  4 mg Oral Q6H PRN Pabon, Diego F, MD       Or  . ondansetron (ZOFRAN) injection 4 mg  4 mg Intravenous Q6H PRN Pabon, Diego F, MD      . ondansetron (ZOFRAN) tablet 4 mg  4 mg Oral Q8H PRN Pabon, Diego F, MD      . piperacillin-tazobactam (ZOSYN) IVPB 3.375 g  3.375 g Intravenous Q12H Pabon, Diego F, MD 12.5 mL/hr at 07/25/20 0517 3.375 g at 07/25/20 0517  . QUEtiapine (  SEROQUEL) tablet 200 mg  200 mg Oral QHS Pabon, [place] F, MD      . tamsulosin (FLOMAX) capsule 0.8 mg  0.8 mg Oral Daily Pabon, [place] F, MD   0.8 mg at 07/25/20 1610   Facility-Administered Medications Ordered in Other Encounters  Medication Dose Route Frequency Provider Last Rate Last Admin  . acetaminophen (OFIRMEV) IV   Intravenous Anesthesia Intra-op Justis, Denise, CRNA   1,000 mg at 07/25/20 0130  . albumin human 5 % solution   Intravenous Continuous PRN Justis, Denise, CRNA   Stopped at 07/25/20 0118  . fentaNYL (SUBLIMAZE) injection   Intravenous Anesthesia Intra-op Justis, Denise, CRNA   50 mcg at 07/24/20 2354  . lactated ringers infusion   Intravenous Continuous PRN Justis, Denise, CRNA   New Bag at 07/25/20 [phone]  . lidocaine (cardiac) 100 mg/71mL (XYLOCAINE) injection 2%   Intravenous Anesthesia Intra-op Justis, Denise, CRNA   100 mg at 07/24/20 2337  . phenylephrine (NEO-SYNEPHRINE) 100 mcg/mL in sodium chloride 0.9 % 100 mL infusion   Intravenous Continuous PRN Justis, Denise, CRNA 18 mL/hr at  07/24/20 2356 30 mcg/min at 07/24/20 2356  . phenylephrine (NEO-SYNEPHRINE) injection   Intravenous Anesthesia Intra-op Justis, Denise, CRNA   100 mcg at 07/24/20 2350  . propofol (DIPRIVAN) 10 mg/mL bolus/IV push   Intravenous Anesthesia Intra-op JustisAngelique Blonder, CRNA   160 mg at 07/24/20 2337  . rocuronium (ZEMURON) injection   Intravenous Anesthesia Intra-op Justis, Denise, CRNA   40 mg at 07/24/20 2340  . succinylcholine (ANECTINE) injection   Intravenous Anesthesia Intra-op Justis, Denise, CRNA   100 mg at 07/24/20 2337  . sugammadex sodium (BRIDION) injection   Intravenous Anesthesia Intra-op Justis, Denise, CRNA   200 mg at 07/25/20 5409      Allergies: Allergies  Allergen Reactions  . Bupropion Other (See Comments)    Seizure   . Desipramine Other (See Comments)    Drunk feeling      Past Medical History: History reviewed. No pertinent past medical history.   Past Surgical History: History reviewed. No pertinent surgical history.   Family History: History reviewed. No pertinent family history.   Social History: Social History   Socioeconomic History  . Marital status: Married    Spouse name: Not on file  . Number of children: Not on file  . Years of education: Not on file  . Highest education level: Not on file  Occupational History  . Not on file  Tobacco Use  . Smoking status: Former Games developer  . Smokeless tobacco: Never Used  Substance and Sexual Activity  . Alcohol use: Yes    Comment: daily  . Drug use: Yes    Types: Cocaine  . Sexual activity: Not on file  Other Topics Concern  . Not on file  Social History Narrative  . Not on file   Social Determinants of Health   Financial Resource Strain: Not on file  Food Insecurity: Not on file  Transportation Needs: Not on file  Physical Activity: Not on file  Stress: Not on file  Social Connections: Not on file  Intimate Partner Violence: Not on file     Review of Systems: Gen: no f/c HEENT: no  vision or hearing c/o CV: no chest pain or sob Resp: no cough GI:+hiccups, prior to admission- abdominal pain and "soft ball" size swelling in abdomen GU : pain in bladder - probably foley irritation MS: no c/o Derm:   no c/o Psych:no c/o Heme: no c/o Neuro:  no c/o Endocrine no c/o  Vital Signs: Blood pressure 115/84, pulse 100, temperature (!) 97.5 F (36.4 C), temperature source Axillary, resp. rate 15, height 6' (1.829 m), weight 73.4 kg, SpO2 98 %.   Intake/Output Summary (Last 24 hours) at 07/25/2020 0959 Last data filed at 07/25/2020 0517 Gross per 24 hour  Intake 2964.15 ml  Output 4250 ml  Net -1285.85 ml    Weight trends: [employer]   07/25/20 0300  Weight: 73.4 kg   Physical Exam: General:  No acute distress, laying in the bed  HEENT  anicteric, dry oral mucous membrane, NGT in place  Pulm/lungs  normal breathing effort, lungs are clear to auscultation  CVS/Heart  regular rhythm, no rub or gallop  Abdomen:   Soft, surgical dressing in place, clean  Extremities:  No peripheral edema  Neurologic:  Alert, oriented, able to follow commands  Skin:  No acute rashes  Foley in place with clear yellow urine    Lab results: Basic Metabolic Panel: Recent Labs  Lab 07/24/20 1945 07/24/20 2205 07/25/20 0456  NA 127* 125* 125*  K 2.7* 3.6 3.5  CL 75* 72* 76*  CO2 22 26 28   GLUCOSE 116* 79 87  BUN 102* 105* 112*  CREATININE 5.25* 5.83* 5.61*  CALCIUM 7.0* 7.5* 7.5*  MG  --  2.6*  --     Liver Function Tests: Recent Labs  Lab 07/25/20 0456  AST 25  ALT 13  ALKPHOS 46  BILITOT 1.7*  PROT 6.7  ALBUMIN 3.7   No results for input(s): LIPASE, AMYLASE in the last 168 hours. No results for input(s): AMMONIA in the last 168 hours.  CBC: Recent Labs  Lab 07/24/20 1945 07/25/20 0456  WBC 11.2* 11.8*  NEUTROABS 9.3* 9.9*  HGB 16.9 14.5  HCT 47.2 39.5  MCV 89.9 89.4  PLT 406* 365    Cardiac Enzymes: No results for input(s): CKTOTAL, TROPONINI in  the last 168 hours.  BNP: Invalid input(s): POCBNP  CBG: Recent Labs  Lab 07/24/20 2011 07/25/20 0257  GLUCAP 115* 92    Microbiology: Recent Results (from the past 720 hour(s))  SARS Coronavirus 2 by RT PCR (hospital order, performed in [facility] hospital lab) Nasopharyngeal Nasopharyngeal Swab     Status: None   Collection Time: 07/24/20  7:46 PM   Specimen: Nasopharyngeal Swab  Result Value Ref Range Status   SARS Coronavirus 2 NEGATIVE NEGATIVE Final    Comment: (NOTE) SARS-CoV-2 target nucleic acids are NOT DETECTED.  The SARS-CoV-2 RNA is generally detectable in upper and lower respiratory specimens during the acute phase of infection. The lowest concentration of SARS-CoV-2 viral copies this assay can detect is 250 copies / mL. A negative result does not preclude SARS-CoV-2 infection and should not be used as the sole basis for treatment or other patient management decisions.  A negative result may occur with improper specimen collection / handling, submission of specimen other than nasopharyngeal swab, presence of viral mutation(s) within the areas targeted by this assay, and inadequate number of viral copies (<250 copies / mL). A negative result must be combined with clinical observations, patient history, and epidemiological information.  Fact Sheet for Patients:   BoilerBrush.com.cy  Fact Sheet for Healthcare Providers: https://pope.com/  This test is not yet approved or  cleared by the [place] FDA and has been authorized for detection and/or diagnosis of SARS-CoV-2 by FDA under an Emergency Use Authorization (EUA).  This EUA will remain in effect (meaning this test  can be used) for the duration of the COVID-19 declaration under Section 564(b)(1) of the Act, 21 U.S.C. section 360bbb-3(b)(1), unless the authorization is terminated or revoked sooner.  Performed at [facility], [address] [place], [place] [address]   Blood culture (routine x 2)     Status: None (Preliminary result)   Collection Time: 07/24/20 10:05 PM   Specimen: BLOOD  Result Value Ref Range Status   Specimen Description BLOOD LEFT ANTECUBITAL  Final   Special Requests   Final    BOTTLES DRAWN AEROBIC AND ANAEROBIC Blood Culture results may not be optimal due to an inadequate volume of blood received in culture bottles   Culture   Final    NO GROWTH < 12 HOURS Performed at [facility], [address], [place], [place] [address]    Report Status PENDING  Incomplete  Blood culture (routine x 2)     Status: None (Preliminary result)   Collection Time: 07/24/20 10:05 PM   Specimen: BLOOD  Result Value Ref Range Status   Specimen Description BLOOD RIGHT ANTECUBITAL  Final   Special Requests   Final    BOTTLES DRAWN AEROBIC AND ANAEROBIC Blood Culture results may not be optimal due to an inadequate volume of blood received in culture bottles   Culture   Final    NO GROWTH < 12 HOURS Performed at [facility], [address] [place], [place] [address]    Report Status PENDING  Incomplete  MRSA PCR Screening     Status: None   Collection Time: 07/25/20  3:00 AM   Specimen: Nasopharyngeal  Result Value Ref Range Status   MRSA by PCR NEGATIVE NEGATIVE Final    Comment:        The GeneXpert MRSA Assay (FDA approved for NASAL specimens only), is one component of a comprehensive MRSA colonization surveillance program. It is not intended to diagnose MRSA infection nor to guide or monitor treatment for MRSA infections. Performed at [facility], [address] [place], [place] [address]      Coagulation Studies: Recent Labs    07/24/20 1945 07/25/20 0456  LABPROT 15.5* 16.5*  INR 1.3* 1.4*    Urinalysis: No results for input(s): COLORURINE, LABSPEC, PHURINE, GLUCOSEU, HGBUR, BILIRUBINUR, KETONESUR, PROTEINUR, UROBILINOGEN, NITRITE, LEUKOCYTESUR in the last 72  hours.  Invalid input(s): APPERANCEUR      Imaging: CT ABDOMEN PELVIS WO CONTRAST  Result Date: 07/24/2020 CLINICAL DATA:  Abdominal distension and bloody stools EXAM: CT ABDOMEN AND PELVIS WITHOUT CONTRAST TECHNIQUE: Multidetector CT imaging of the abdomen and pelvis was performed following the standard protocol without IV contrast. COMPARISON:  07/10/2009 FINDINGS: Lower chest: No acute abnormality. Hepatobiliary: No focal liver abnormality is seen. No gallstones, gallbladder wall thickening, or biliary dilatation. Pancreas: Unremarkable. No pancreatic ductal dilatation or surrounding inflammatory changes. 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 Adrenals/Urinary Tract: Adrenal glands are within normal limits bilaterally. The kidneys are well visualized bilaterally. Tiny nonobstructing left renal stones are noted. The bladder is decompressed. Stomach/Bowel: Scattered diverticular change of the colon is noted without evidence of diverticulitis. Colon is predominately decompressed. The appendix is unremarkable. Stomach is significantly distended with fluid and food stuffs. This extends into the jejunum and is secondary to herniation of the proximal jejunum through a defect in the abdominal wall on the left. There is a hernia identified in the left mid abdomen anteriorly which extends between layers of the abdominal wall  musculature. The more distal small bowel is decompressed. Vascular/Lymphatic: Aortic atherosclerosis. No enlarged abdominal or pelvic lymph nodes. Reproductive: Prostate is unremarkable. Other: No abdominal wall hernia or abnormality. No abdominopelvic ascites. Musculoskeletal: Degenerative changes of lumbar spine are noted. 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 The abdominal wall defect was seen on  the prior exam although no herniation was noted at that time. Tiny nonobstructing left renal calculi. Changes of prior splenectomy. Electronically Signed   By: Alcide Clever M.D.   On: 07/24/2020 21:45   CT Head Wo Contrast  Result Date: 07/24/2020 CLINICAL DATA:  Seizure, nontraumatic (Age >= 41y) EXAM: CT HEAD WITHOUT CONTRAST TECHNIQUE: Contiguous axial images were obtained from the base of the skull through the vertex without intravenous contrast. COMPARISON:  Head CT 11/13/2019 FINDINGS: Brain: Brain volume is normal for age. No intracranial hemorrhage, mass effect, or midline shift. No hydrocephalus. Incidental mega cisterna magna. The basilar cisterns are patent. No evidence of territorial infarct or acute ischemia. No extra-axial or intracranial fluid collection. Vascular: Atherosclerosis of skullbase vasculature without hyperdense vessel or abnormal calcification. Skull: Bilateral parietal craniotomy.  No fracture or focal lesion. Sinuses/Orbits: Paranasal sinuses and mastoid air cells are clear. The visualized orbits are unremarkable. Multiple dental caries are partially included. Other: None. IMPRESSION: No acute intracranial abnormality or explanation for seizure. Electronically Signed   By: Narda Rutherford M.D.   On: 07/24/2020 21:38   CT Cervical Spine Wo Contrast  Result Date: 07/24/2020 CLINICAL DATA:  Neck trauma, focal neuro deficit or paresthesia (Age 64-64y) EXAM: CT CERVICAL SPINE WITHOUT CONTRAST TECHNIQUE: Multidetector CT imaging of the cervical spine was performed without intravenous contrast. Multiplanar CT image reconstructions were also generated. COMPARISON:  None. FINDINGS: Alignment: Normal. Skull base and vertebrae: No acute fracture. Vertebral body heights are maintained. The dens and skull base are intact. Soft tissues and spinal canal: No prevertebral fluid or swelling. No visible canal hematoma. Disc levels:  Nonacute.  Minor endplate spurring. Upper chest: No acute  findings. Other: None. IMPRESSION: No acute fracture or subluxation of the cervical spine. Electronically Signed   By: Narda Rutherford M.D.   On: 07/24/2020 21:40   DG Chest Portable 1 View  Result Date: 07/24/2020 CLINICAL DATA:  Chest pain. EXAM: PORTABLE CHEST 1 VIEW COMPARISON:  Radiograph 11/13/2019 FINDINGS: The cardiomediastinal contours are normal. Minor bibasilar atelectasis. Pulmonary vasculature is normal. No consolidation, pleural effusion, or pneumothorax. Remote left rib fractures. No acute osseous abnormalities are seen. IMPRESSION: Minor bibasilar atelectasis. Electronically Signed   By: Narda Rutherford M.D.   On: 07/24/2020 20:01      Assessment & Plan: Pt is a 55 y.o.   male with , was admitted on 07/24/2020 with Bowel obstruction (HCC) [K56.609] AKI (acute kidney injury) (HCC) [N17.9] Hypotension, unspecified hypotension type [I95.9] Acute renal failure, unspecified acute renal failure type (HCC) [N17.9] Non-recurrent abdominal hernia with obstruction without gangrene, unspecified hernia type [K46.0]   # AKI Baseline Creatinine of 0.76 on Jun 14, 2020 Admitted with high Creatinine Will obtain u/a Imaging: CT abdomen without contrast 07/24/2020- tiny non obstructing stones, bladder decompressed Patient was hypotensive on admission at 77/64 AKI likely due to ATN from hypotension and volume depletion  Lab Results  Component Value Date   CREATININE 5.61 (H) 07/25/2020   CREATININE 5.83 (H) 07/24/2020   CREATININE 5.25 (H) 07/24/2020   02/01 0701 -  02/02 0700 In: 2964.2 [P.O.:30; I.V.:1934.2; IV Piggyback:1000] Out: 4250 [Urine:350; Emesis/NG output:1000]   Will monitor UOP No acute indication for HD at present Continue iv hydration  # 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 Any transcription errors are unintentional

## 2020-07-25 NOTE — Progress Notes (Addendum)
[facility]ESS [facility]  Hospital Day(s): 1.   Post op day(s): 1 Day Post-Op.   Interval History:  Patient seen and examined No acute events or new complaints overnight.  Patient reports he feels better from presentation but has a lot of abdominal soreness and sore throat from NGT No fever, chills, nausea, emesis Remains with mild, likely reactive, leukocytosis to 11.8K Renal function remains elevated with sCr - 5.61, UO 350 ccs Persistent hyponatremia to 125, previous hypokalemia resolved Previous lactic acidosis now resolved with at 1.5 NGT output 1000 ccs No evidence of bowel function return He continues on Zosyn  Vital signs in last 24 hours: [min-max] current  Temp:  [98 F (36.7 C)-98.7 F (37.1 C)] 98.7 F (37.1 C) (02/02 0400) Pulse Rate:  [82-97] 97 (02/02 0500) Resp:  [11-29] 18 (02/02 0500) BP: (77-142)/(64-101) 106/78 (02/02 0500) SpO2:  [92 %-100 %] 96 % (02/02 0500) Weight:  [73.4 kg] 73.4 kg (02/02 0300)     Height: 6' (182.9 cm) Weight: 73.4 kg BMI (Calculated): 21.94   Intake/Output last 2 shifts:  02/01 0701 - 02/02 0700 In: 2964.2 [P.O.:30; I.V.:1934.2; IV Piggyback:1000] Out: 4250 [Urine:350; Emesis/NG output:1000]   Physical Exam:  Constitutional: alert, cooperative and no distress. Officer at bedside HEENT: NGT in place; output high, appears feculent Respiratory: breathing non-labored at rest  Cardiovascular: regular rate and sinus rhythm  Gastrointestinal: Soft, incisional soreness, non-distended, no rebound/guarding. Genitourinary: Foley catheter in place  Integumentary: Left mid abdomen incision is CDI with staples, no erythema, minimal drainage on dressing  Labs:  CBC Latest Ref Rng & Units 07/25/2020 07/24/2020 06/14/2020  WBC 4.0 - 10.5 K/uL 11.8(H) 11.2(H) 7.9  Hemoglobin 13.0 - 17.0 g/dL 83.3 82.5 05.3  Hematocrit 39.0 - 52.0 % 39.5 47.2 43.5  Platelets 150 - 400 K/uL 365 406(H) 448(H)   CMP Latest Ref Rng &  Units 07/24/2020 07/24/2020 06/14/2020  Glucose 70 - 99 mg/dL 79 976(B) 85  BUN 6 - 20 mg/dL 341(P) 379(K) 14  Creatinine 0.61 - 1.24 mg/dL 2.40(X) 7.35(H) 2.99  Sodium 135 - 145 mmol/L 125(L) 127(L) 140  Potassium 3.5 - 5.1 mmol/L 3.6 2.7(LL) 4.0  Chloride 98 - 111 mmol/L 72(L) 75(L) 103  CO2 22 - 32 mmol/L 26 22 29   Calcium 8.9 - 10.3 mg/dL 7.5(L) 7.0(L) 9.2  Total Protein 6.5 - 8.1 g/dL - 7.4 -  Total Bilirubin 0.3 - 1.2 mg/dL - ) -  Alkaline Phos 38 - 126 U/L - 54 -  AST 15 - 41 U/L - 34 -  ALT 0 - 44 U/L - 15 -     Imaging studies: No new pertinent imaging studies   Assessment/Plan:  55 y.o. male 1 Day Post-Op s/p exploratory laparotomy and small bowel resection for strangulated incisional hernia, complicated by AKI secondary to likely septic shock   - Recommend continuation of NGT to LIS; monitor and record output  - Recommend continuation of foley catheter for now to monitor UO given AKI; nephrology consulted  - Continue NPO  - Aggressive IVF resuscitation  - Continue IV ABx (Zosyn)  - Monitor abdominal examination; on-going bowel function  - Pain control prn; antiemetics prn  - Mobilization as tolerated; low threshold to engage PT  - Further management per primary service; should be okay to transfer to floor  All of the above findings and recommendations were discussed with the patient, and the medical team, and all of patient's questions were answered to his expressed satisfaction.  --  Lynden Oxford, PA-C [facility] 07/25/2020, 7:21 AM [phone] M-F: 7am - 4pm

## 2020-07-25 NOTE — Progress Notes (Signed)
eeg done °

## 2020-07-25 NOTE — Op Note (Signed)
PROCEDURES: 1. Laparotomy w reduction  And primary repair of strangulated incisional hernia 2. Small bowel resection  Pre-operative Diagnosis: strangulated incisional ventral hernia  Post-operative Diagnosis: same  Surgeon: Merri Ray Antonieta Slaven   Anesthesia: General endotracheal anesthesia  ASA Class: 4   Surgeon: Sterling Big , MD FACS  Anesthesia: Gen. with endotracheal tube  Findings: strangulated incisional hernia left flank with ischemic bowel and some spots of necrosis on antimesenteric border of bowel strangulated omentum  Estimated Blood Loss: 25cc              Specimens: omentum and small bowel       Complications: none         Procedure Details  The patient was seen again in the Holding Room. The benefits, complications, treatment options, and expected outcomes were discussed with the patient. The risks of bleeding, infection, recurrence of symptoms, failure to resolve symptoms,  bowel injury, any of which could require further surgery were reviewed with the patient.   The patient was taken to Operating Room, identified as THAYER EMBLETON and the procedure verified.  A Time Out was held and the above information confirmed.  Prior to the induction of general anesthesia, antibiotic prophylaxis was administered. VTE prophylaxis was in place. General endotracheal anesthesia was then administered and tolerated well. After the induction, the abdomen was prepped with Chloraprep and draped in the sterile fashion. The patient was positioned in the supine position. Flank incision was created over the strangulated hernia.  Subcutaneous tissue was dissected with electrocautery and the external oblique muscle was divided with cautery.  We visualized the hernia sac that was purple the hernia was not able to be reduced.  I was able to identify the plane and actually open the hernia sac.  There was a segment of jejunum that was incarcerated with ischemic changes and some areas of necrosis on the  antimesenteric border.  There was also evidence of a necrotic piece of greater omentum.  The omentum was [facility] with electrocautery in the standard fashion.  Attention was turned to the small bowel and we were able to perform a small bowel resection by creating 2 windows with good proximal distal margins of viability of the bowel.  Using 75 stapler device we divided the small bowel and the mesentery was clamped and divided with multiple 2-0 silk sutures.  A side-to-side functional end-to-end anastomosis was performed with a 75 stapler in the standard fashion.  There is no evidence of tension with anastomosis and there was good perfusion.  Mesenteric defect was closed with a 3-0 Vicryl in a running fashion.  Tension there was turned to the ventral defect and we were able to close this in a 2 layer fashion posterior layer was closed with a running 0 PDS suture the anterior layer was closed with multiple interrupted 0 Ethibond sutures in the standard fashion.  I was able to place liposomal Marcaine for postoperative analgesia.  Subcutaneous tissue was closed with 3-0 Vicryl and the skin was closed with staples       Needle and laparotomy count were correct and there were no immediate complications.  Sterling Big, MD, FACS

## 2020-07-25 NOTE — Anesthesia Postprocedure Evaluation (Signed)
Anesthesia Post Note  Patient: Alan Nelson  Procedure(s) Performed: EXPLORATORY LAPAROTOMY TO FIX VENTRAL HERNIA (N/A Abdomen)  Patient location during evaluation: ICU Anesthesia Type: General Level of consciousness: sedated Pain management: pain level controlled Vital Signs Assessment: post-procedure vital signs reviewed and stable Respiratory status: patient on ventilator - see flowsheet for VS Cardiovascular status: blood pressure returned to baseline and stable Anesthetic complications: no   No complications documented.   Last Vitals:  Vitals:   07/25/20 0400 07/25/20 0500  BP: 115/84 106/78  Pulse: 92 97  Resp: 14 18  Temp: 37.1 C   SpO2: 98% 96%    Last Pain:  Vitals:   07/25/20 0400  TempSrc: Axillary  PainSc: 0-No pain                 Chiropodist

## 2020-07-25 NOTE — Progress Notes (Signed)
Patient arrived from unit.  He appears to be in no apparent distress 2L [place]. Tele monitor placed.    Hypotensive. Provider notified and new orders received.  NGT to low intermittent suction Foley in place. IVF infusing.  Pt alert and oriented. Oriented to room. Educated on plan of care.  Verbalizes an understanding Denies any additional needs at this time Call bell within reach.  Will continue to closely monitor.

## 2020-07-25 NOTE — Transfer of Care (Signed)
Immediate Anesthesia Transfer of Care Note  Patient: Alan Nelson  Procedure(s) Performed: EXPLORATORY LAPAROTOMY TO FIX VENTRAL HERNIA (N/A Abdomen)  Patient Location: PACU  Anesthesia Type:General  Level of Consciousness: awake, alert , oriented and patient cooperative  Airway & Oxygen Therapy: Patient Spontanous Breathing  Post-op Assessment: Report given to RN and Post -op Vital signs reviewed and stable  Post vital signs: Reviewed and stable  Last Vitals:  Vitals Value Taken Time  BP 137/73 07/25/20 0131  Temp 36.9 C 07/25/20 0119  Pulse 86 07/25/20 0134  Resp 12 07/25/20 0134  SpO2 99 % 07/25/20 0134  Vitals shown include unvalidated device data.  Last Pain:  Vitals:   07/25/20 0119  TempSrc:   PainSc: 0-No pain         Complications: No complications documented.

## 2020-07-25 NOTE — Progress Notes (Addendum)
PROGRESS NOTE    Alan GoresKenneth M Nelson  ZOX:[id number] DOB: 01/06/1966 DOA: 07/24/2020 PCP: Burnis MedinFulbright, Virginia E, PA-C  Outpatient Specialists: none    Brief Narrative:   Alan Nelson  is a 55 y.o. Caucasian male with a known history of seizure disorder, hypertension and migraine as well as asthma, BPH, anxiety/depression, polysubstance abuse and lumbar disc disease, who presented to the emergency room with acute onset of seizure followed by postictal phase with confusion.  He was found hypotensive with a blood pressure in the 50s systolic, complaining of abdominal pain mainly in the lower abdomen with associated nausea and vomiting.  He admitted to tactile fever and chills.  He has been having mild headache.  His abdominal pain is started on Thursday and got significantly worse today.  No chest pain or palpitations.  No bleeding diathesis.  Upon presentation to the ER, blood pressure was 77/64 with otherwise normal vital signs.  Later respiratory rate was 22 then 29 and with hydration blood pressure was up to 92/77 and later 126/99.  Labs revealed CMP with hyper kalemia, hyponatremia hypochloremia BUN of 102 and creatinine of 5.25 with a calcium of 7 indirect bili of 1.2 and total bili 1.4, high-sensitivity troponin I 145 lactic acid 4.9 with CBC showing leukocytosis of 11.2 with mild neutrophilia and thrombocytosis with INR 1.3 and PT 15.5.  COVID-19 PCR came back negative.  Portable chest ray showed minor bibasilar atelectasis.  Head CT scan revealed no acute abnormalities.  C-spine CT showed no fracture or subluxation.  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 The abdominal wall defect was seen on the prior exam although no herniation was noted at that time.  Tiny nonobstructing left renal calculi.  Changes of prior  splenectomy.  The patient was given IV Zosyn, IV potassium chloride, 3 L bolus of IV normal saline, IV fentanyl and Ativan as well as Zofran followed by infusion of IV normal saline.  Dr. Everlene FarrierPabon was notified about the patient and planned emergent laparotomy.  The patient will be admitted to stepdown unit for further evaluation and management.   Assessment & Plan:   Active Problems:   Bowel obstruction (HCC)  # Incarcerated small bowel hernia # Hypotension S/p emergent ex lap with partial small bowel resection last night. NG tube out 1 L last 24 hours  - gen surg consulting, appreciate recs - cont npo for now - cont zosyn (2/1> ) - cont IV fluids - fentanyl prn pain  # Acute kidney injury # Hyponatremia Cr 5s, prior normal. Likely 2/2 profound hypotension that is now resolved. No hyperkalemia or acidosis. uop 350 last 24 hours. Na 125 likely 2/2 acute kidney dysfunction - continue IVF - nephrology consulted, will see today - renal u/s pending  # Seizure Hx seizure, presented post-ictal, likely precipitated by above acute illness. Post-ictal state has resolved - f/u neuro consult  # Opioid dependence - hold home suboxone given severe AKI  # Bipolar - cont home seroquel  # BPH - cont home flomax  DVT prophylaxis: heparin Code Status: full Family Communication: none @ bedside  Level of care: Stepdown Status is: Inpatient  Remains inpatient appropriate because:Inpatient level of care appropriate due to severity of illness   [place]: [place] [facility]  Anticipated d/c is to: tbd              Anticipated d/c date is: > 3 days              Patient currently is not medically stable to d/c.   Difficult to place patient: potentially        Consultants:  Neurology, nephrology, gen surg  Procedures: 2/2 ex lap with small bowel partial resection  Antimicrobials:  Zosyn 2/1>    Subjective: This morning feeling improved. Has  some mild/mod abd pain improved w/ fentanyl. Denies nausea. No diarrhea. No chest pain.  Objective: Vitals:   07/25/20 0500 07/25/20 0700 07/25/20 0800 07/25/20 0900  BP: 106/78 118/83 111/76 115/84  Pulse: 97 98 99 100  Resp: 18 13 17 15   Temp:   (!) 97.5 F (36.4 C)   TempSrc:   Axillary   SpO2: 96% 97% 97% 98%  Weight:      Height:        Intake/Output Summary (Last 24 hours) at 07/25/2020 0924 Last data filed at 07/25/2020 0517 Gross per 24 hour  Intake 2964.15 ml  Output 4250 ml  Net -1285.85 ml   Filed Weights   07/25/20 0300  Weight: 73.4 kg    Examination:  General exam: Appears calm and comfortable  Respiratory system: Clear to auscultation. Respiratory effort normal. Cardiovascular system: S1 & S2 heard, RRR. No JVD, murmurs, rubs, gallops or clicks. No pedal edema. Gastrointestinal system: abd mldly distended, ttp upper quadrants, incision c/d/i and dressed Central nervous system: Alert and oriented. No focal neurological deficits. Extremities: Symmetric 5 x 5 power. Skin: No rashes, lesions or ulcers Psychiatry: Judgement and insight appear normal. Mood & affect appropriate.     Data Reviewed: I have personally reviewed following labs and imaging studies  CBC: Recent Labs  Lab 07/24/20 1945 07/25/20 0456  WBC 11.2* 11.8*  NEUTROABS 9.3* 9.9*  HGB 16.9 14.5  HCT 47.2 39.5  MCV 89.9 89.4  PLT 406* 365   Basic Metabolic Panel: Recent Labs  Lab 07/24/20 1945 07/24/20 2205 07/25/20 0456  NA 127* 125* 125*  K 2.7* 3.6 3.5  CL 75* 72* 76*  CO2 22 26 28   GLUCOSE 116* 79 87  BUN 102* 105* 112*  CREATININE 5.25* 5.83* 5.61*  CALCIUM 7.0* 7.5* 7.5*  MG  --  2.6*  --    GFR: Estimated Creatinine Clearance: 16 mL/min (A) (by C-G formula based on SCr of 5.61 mg/dL (H)). Liver Function Tests: Recent Labs  Lab 07/24/20 1945 07/25/20 0456  AST 34 25  ALT 15 13  ALKPHOS 54 46  BILITOT 1.4* 1.7*  PROT 7.4 6.7  ALBUMIN 3.5 3.7   No results for  input(s): LIPASE, AMYLASE in the last 168 hours. No results for input(s): AMMONIA in the last 168 hours. Coagulation Profile: Recent Labs  Lab 07/24/20 1945 07/25/20 0456  INR 1.3* 1.4*   Cardiac Enzymes: No results for input(s): CKTOTAL, CKMB, CKMBINDEX, TROPONINI in the last 168 hours. BNP (last 3 results) No results for input(s): PROBNP in the last 8760 hours. HbA1C: No results for input(s): HGBA1C in the last 72 hours. CBG: Recent Labs  Lab 07/24/20 2011 07/25/20 0257  GLUCAP 115* 92   Lipid Profile: No results for input(s): CHOL, HDL, LDLCALC, TRIG, CHOLHDL, LDLDIRECT in the last 72 hours. Thyroid Function Tests: No results for input(s): TSH, T4TOTAL, FREET4, T3FREE, THYROIDAB in the last 72 hours. Anemia Panel: No results for input(s): VITAMINB12, FOLATE,  FERRITIN, TIBC, IRON, RETICCTPCT in the last 72 hours. Urine analysis: No results found for: COLORURINE, APPEARANCEUR, LABSPEC, PHURINE, GLUCOSEU, HGBUR, BILIRUBINUR, KETONESUR, PROTEINUR, UROBILINOGEN, NITRITE, LEUKOCYTESUR Sepsis Labs: @LABRCNTIP (procalcitonin:4,lacticidven:4)  ) Recent Results (from the past 240 hour(s))  SARS Coronavirus 2 by RT PCR (hospital order, performed in [facility] hospital lab) Nasopharyngeal Nasopharyngeal Swab     Status: None   Collection Time: 07/24/20  7:46 PM   Specimen: Nasopharyngeal Swab  Result Value Ref Range Status   SARS Coronavirus 2 NEGATIVE NEGATIVE Final    Comment: (NOTE) SARS-CoV-2 target nucleic acids are NOT DETECTED.  The SARS-CoV-2 RNA is generally detectable in upper and lower respiratory specimens during the acute phase of infection. The lowest concentration of SARS-CoV-2 viral copies this assay can detect is 250 copies / mL. A negative result does not preclude SARS-CoV-2 infection and should not be used as the sole basis for treatment or other patient management decisions.  A negative result may occur with improper specimen collection / handling,  submission of specimen other than nasopharyngeal swab, presence of viral mutation(s) within the areas targeted by this assay, and inadequate number of viral copies (<250 copies / mL). A negative result must be combined with clinical observations, patient history, and epidemiological information.  Fact Sheet for Patients:   09/21/20  Fact Sheet for Healthcare Providers: BoilerBrush.com.cy  This test is not yet approved or  cleared by the https://pope.com/ FDA and has been authorized for detection and/or diagnosis of SARS-CoV-2 by FDA under an Emergency Use Authorization (EUA).  This EUA will remain in effect (meaning this test can be used) for the duration of the COVID-19 declaration under Section 564(b)(1) of the Act, 21 U.S.C. section 360bbb-3(b)(1), unless the authorization is terminated or revoked sooner.  Performed at [facility], [address] [place], [place]   Blood culture (routine x 2)     Status: None (Preliminary result)   Collection Time: 07/24/20 10:05 PM   Specimen: BLOOD  Result Value Ref Range Status   Specimen Description BLOOD LEFT ANTECUBITAL  Final   Special Requests   Final    BOTTLES DRAWN AEROBIC AND ANAEROBIC Blood Culture results may not be optimal due to an inadequate volume of blood received in culture bottles   Culture   Final    NO GROWTH < 12 HOURS Performed at [facility], [address] [place], [place]    Report Status PENDING  Incomplete  Blood culture (routine x 2)     Status: None (Preliminary result)   Collection Time: 07/24/20 10:05 PM   Specimen: BLOOD  Result Value Ref Range Status   Specimen Description BLOOD RIGHT ANTECUBITAL  Final   Special Requests   Final    BOTTLES DRAWN AEROBIC AND ANAEROBIC Blood Culture results may not be optimal due to an inadequate volume of blood received in culture bottles   Culture   Final    NO GROWTH < 12  HOURS Performed at [facility], [address], [place], [place]    Report Status PENDING  Incomplete  MRSA PCR Screening     Status: None   Collection Time: 07/25/20  3:00 AM   Specimen: Nasopharyngeal  Result Value Ref Range Status   MRSA by PCR NEGATIVE NEGATIVE Final    Comment:        The GeneXpert MRSA Assay (FDA approved for NASAL specimens only), is one component of a comprehensive MRSA colonization surveillance program. It is  not intended to diagnose MRSA infection nor to guide or monitor treatment for MRSA infections. Performed at [facility], [address] [place], [place] [address]          Radiology Studies: CT ABDOMEN PELVIS WO CONTRAST  Result Date: 07/24/2020 CLINICAL DATA:  Abdominal distension and bloody stools EXAM: CT ABDOMEN AND PELVIS WITHOUT CONTRAST TECHNIQUE: Multidetector CT imaging of the abdomen and pelvis was performed following the standard protocol without IV contrast. COMPARISON:  07/10/2009 FINDINGS: Lower chest: No acute abnormality. Hepatobiliary: No focal liver abnormality is seen. No gallstones, gallbladder wall thickening, or biliary dilatation. Pancreas: Unremarkable. No pancreatic ductal dilatation or surrounding inflammatory changes. 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 Adrenals/Urinary Tract: Adrenal glands are within normal limits bilaterally. The kidneys are well visualized bilaterally. Tiny nonobstructing left renal stones are noted. The bladder is decompressed. Stomach/Bowel: Scattered diverticular change of the colon is noted without evidence of diverticulitis. Colon is predominately decompressed. The appendix is unremarkable. Stomach is significantly distended with fluid and food stuffs. This extends into the jejunum and is secondary to herniation of the proximal jejunum through a defect in the abdominal wall on the left. There is a hernia identified in  the left mid abdomen anteriorly which extends between layers of the abdominal wall musculature. The more distal small bowel is decompressed. Vascular/Lymphatic: Aortic atherosclerosis. No enlarged abdominal or pelvic lymph nodes. Reproductive: Prostate is unremarkable. Other: No abdominal wall hernia or abnormality. No abdominopelvic ascites. Musculoskeletal: Degenerative changes of lumbar spine are noted. 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 The abdominal wall defect was seen on the prior exam although no herniation was noted at that time. Tiny nonobstructing left renal calculi. Changes of prior splenectomy. Electronically Signed   By: Alcide Clever M.D.   On: 07/24/2020 21:45   CT Head Wo Contrast  Result Date: 07/24/2020 CLINICAL DATA:  Seizure, nontraumatic (Age >= 41y) EXAM: CT HEAD WITHOUT CONTRAST TECHNIQUE: Contiguous axial images were obtained from the base of the skull through the vertex without intravenous contrast. COMPARISON:  Head CT 11/13/2019 FINDINGS: Brain: Brain volume is normal for age. No intracranial hemorrhage, mass effect, or midline shift. No hydrocephalus. Incidental mega cisterna magna. The basilar cisterns are patent. No evidence of territorial infarct or acute ischemia. No extra-axial or intracranial fluid collection. Vascular: Atherosclerosis of skullbase vasculature without hyperdense vessel or abnormal calcification. Skull: Bilateral parietal craniotomy.  No fracture or focal lesion. Sinuses/Orbits: Paranasal sinuses and mastoid air cells are clear. The visualized orbits are unremarkable. Multiple dental caries are partially included. Other: None. IMPRESSION: No acute intracranial abnormality or explanation for seizure. Electronically Signed   By: Narda Rutherford M.D.   On: 07/24/2020 21:38   CT Cervical Spine Wo Contrast  Result  Date: 07/24/2020 CLINICAL DATA:  Neck trauma, focal neuro deficit or paresthesia (Age 30-64y) EXAM: CT CERVICAL SPINE WITHOUT CONTRAST TECHNIQUE: Multidetector CT imaging of the cervical spine was performed without intravenous contrast. Multiplanar CT image reconstructions were also generated. COMPARISON:  None. FINDINGS: Alignment: Normal. Skull base and vertebrae: No acute fracture. Vertebral body heights are maintained. The dens and skull base are intact. Soft tissues and spinal canal: No prevertebral fluid or swelling. No visible canal hematoma. Disc levels:  Nonacute.  Minor endplate spurring. Upper chest: No acute findings. Other: None. IMPRESSION: No acute fracture or subluxation of  the cervical spine. Electronically Signed   By: Narda Rutherford M.D.   On: 07/24/2020 21:40   DG Chest Portable 1 View  Result Date: 07/24/2020 CLINICAL DATA:  Chest pain. EXAM: PORTABLE CHEST 1 VIEW COMPARISON:  Radiograph 11/13/2019 FINDINGS: The cardiomediastinal contours are normal. Minor bibasilar atelectasis. Pulmonary vasculature is normal. No consolidation, pleural effusion, or pneumothorax. Remote left rib fractures. No acute osseous abnormalities are seen. IMPRESSION: Minor bibasilar atelectasis. Electronically Signed   By: Narda Rutherford M.D.   On: 07/24/2020 20:01        Scheduled Meds: . buprenorphine-naloxone  1 tablet Sublingual BID  . Chlorhexidine Gluconate Cloth  6 each Topical Q0600  . heparin  5,000 Units Subcutaneous Q8H  . QUEtiapine  200 mg Oral QHS  . tamsulosin  0.8 mg Oral Daily   Continuous Infusions: . 0.9 % NaCl with KCl 40 mEq / L 125 mL/hr at 07/25/20 0517  . acetaminophen 1,000 mg (07/25/20 0849)  . metronidazole 500 mg (07/25/20 0646)  . piperacillin-tazobactam 3.375 g (07/25/20 0517)     LOS: 1 day    Time spent: 40 min    Silvano Bilis, MD [facility]   If 7PM-7AM, please contact night-coverage [email or web address] Password [facility] 07/25/2020, 9:24 AM

## 2020-07-25 NOTE — Progress Notes (Signed)
PHARMACY NOTE:  ANTIMICROBIAL RENAL DOSAGE ADJUSTMENT  Current antimicrobial regimen includes a mismatch between antimicrobial dosage and estimated renal function.  As per policy approved by the Pharmacy & Therapeutics and Medical Executive Committees, the antimicrobial dosage will be adjusted accordingly.  Current antimicrobial dosage:  Zosyn 3.375 gm IV Q6H  Indication:  Intra abdominal infection   Renal Function:  Estimated Creatinine Clearance: 15.4 mL/min (A) (by C-G formula based on SCr of 5.83 mg/dL (H)). []      On intermittent HD, scheduled: []      On CRRT    Antimicrobial dosage has been changed to:  Zosyn 3.375 gm IV Q12H EI   Additional comments:   Thank you for allowing pharmacy to be a part of this patient's care.  Lofton Leon D, [facility] 07/25/2020 3:38 AM

## 2020-07-25 NOTE — Progress Notes (Signed)
PHARMACY NOTE:  ANTIMICROBIAL RENAL DOSAGE ADJUSTMENT  Current antimicrobial regimen includes a mismatch between antimicrobial dosage and estimated renal function.  As per policy approved by the Pharmacy & Therapeutics and Medical Executive Committees, the antimicrobial dosage will be adjusted accordingly.  Current antimicrobial dosage:  Zosyn 3.375 g IV q12h  Indication: intra-abdominal infection  Renal Function:  Estimated Creatinine Clearance: 16 mL/min (A) (by C-G formula based on SCr of 5.61 mg/dL (H)).    Antimicrobial dosage has been changed to:  Zosyn 2.25 g IV q8h    Thank you for allowing pharmacy to be a part of this patient's care.  Laureen Ochs, PharmD 07/25/2020 3:04 PM

## 2020-07-25 NOTE — Consult Note (Signed)
Neurology Consultation Reason for Consult: Seizure Referring Physician: Ashok Pall, N  CC: Possible seizure  History is obtained from: Patient, chart review  HPI: Alan Nelson is a 55 y.o. male who has had two previous seizures in the setting of Wellbutrin use who presents with a convulsive episode.  Unfortunately I do not have a firsthand account of the convulsion, but the patient has been ill and nauseated.  He remembers sitting up on the edge of the bed to throw up and then felt lightheaded for a brief moment and then the next thing he knew he was somewhere else.  From chart review, he was reportedly postictal afterwards, however it is unclear what this means given that the patient had a systolic of 50 in that setting.  I am uncertain how long he was severely hypotensive.  In the emergency department, he was found to have an incarcerated hernia and was taken emergently for laparotomy.  He denies staring spells where people are unable to get him to respond ("I zone all from time to time, but  if someone tries to get me to respond, I will.")  He denies unprovoked seizures, with the only other seizures occurring in the setting of Wellbutrin use.  He denies recent alcohol use or changes in alcohol use.  ROS: A 14 point ROS was performed and is negative except as noted in the HPI.   Past medical history: Seizure in the setting of Wellbutrin use Hypertension Migraine BPH Polysubstance abuse  History reviewed. No pertinent family history.   Social History:  reports that he has quit smoking. He has never used smokeless tobacco. He reports current alcohol use. He reports current drug use. Drug: Cocaine.   Exam: Current vital signs: BP 94/62   Pulse (!) 107   Temp (!) 97.5 F (36.4 C) (Axillary)   Resp 12   Ht 6' (1.829 m)   Wt 73.4 kg   SpO2 96%   BMI 21.95 kg/m  Vital signs in last 24 hours: Temp:  [97.5 F (36.4 C)-98.7 F (37.1 C)] 97.5 F (36.4 C) (02/02 0800) Pulse Rate:   [82-107] 107 (02/02 1100) Resp:  [8-29] 12 (02/02 1100) BP: (77-142)/(62-101) 94/62 (02/02 1100) SpO2:  [91 %-100 %] 96 % (02/02 1100) Weight:  [73.4 kg] 73.4 kg (02/02 0300)   Physical Exam  Constitutional: Appears well-developed and well-nourished.  Psych: Affect appropriate to situation Eyes: No scleral injection HENT: No OP obstruction MSK: no joint deformities.  Cardiovascular: Normal rate and regular rhythm.  Respiratory: Effort normal, non-labored breathing GI: Soft.  No distension. There is no tenderness.  Skin: WDI  Neuro: Mental Status: Patient is awake, alert, oriented to person, place, month, year, and situation. Patient is able to give a clear and coherent history. No signs of aphasia or neglect Cranial Nerves: II: Visual Fields are full. Pupils are equal, round, and reactive to light.   III,IV, VI: He has a right exophoria, but is able to look well to the right and left. V: Facial sensation is symmetric to temperature VII: Facial movement is symmetric.  VIII: hearing is intact to voice X: Uvula elevates symmetrically XI: Shoulder shrug is symmetric. XII: tongue is midline without atrophy or fasciculations.  Motor: Tone is normal. Bulk is normal. 5/5 strength was present in all four extremities.  Sensory: Sensation is symmetric to light touch and temperature in the arms and legs. Cerebellar: He is tremulous without ataxia on finger-nose-finger bilaterally.   I have reviewed labs in epic and  the results pertinent to this consultation are: Sodium 127 Calcium 7.0 with an albumin of 3.5   I have reviewed the images obtained: CT head-negative  Impression: 55 year old male with a history of seizures in the setting of Wellbutrin use who presents with reported convulsion in the setting of severe hypotension.  Especially given that he had just sat up, and was vomiting which can provoke a vagal response, my suspicion is that this likely represented convulsive syncope  with his postictal state actually being cerebral hypoperfusion from severe hypotension.  Given his history of seizures in the setting of Wellbutrin use, I do think an EEG would be reasonable.  Even if this had been seizure, it would be provoked in the setting of severe hypotension, hypocalcemia, hyponatremia.  I do not think he merits antiepileptic therapy at this time.  Recommendations: 1) would favor correction of hypocalcemia, hyponatremia per internal medicine team 2) EEG 3) if EEG is negative, no further recommendations from a neurological standpoint, avoid severe hypotension.  Ritta Slot, MD [facility] [phone]  If 7pm- 7am, please page neurology on call as listed in AMION.

## 2020-07-26 ENCOUNTER — Inpatient Hospital Stay

## 2020-07-26 ENCOUNTER — Inpatient Hospital Stay: Payer: Self-pay

## 2020-07-26 DIAGNOSIS — K56601 Complete intestinal obstruction, unspecified as to cause: Secondary | ICD-10-CM | POA: Diagnosis not present

## 2020-07-26 DIAGNOSIS — E43 Unspecified severe protein-calorie malnutrition: Secondary | ICD-10-CM | POA: Insufficient documentation

## 2020-07-26 LAB — BASIC METABOLIC PANEL
Anion gap: 15 (ref 5–15)
BUN: 106 mg/dL — ABNORMAL HIGH (ref 6–20)
CO2: 28 mmol/L (ref 22–32)
Calcium: 7.3 mg/dL — ABNORMAL LOW (ref 8.9–10.3)
Chloride: 88 mmol/L — ABNORMAL LOW (ref 98–111)
Creatinine, Ser: 4.07 mg/dL — ABNORMAL HIGH (ref 0.61–1.24)
GFR, Estimated: 17 mL/min — ABNORMAL LOW (ref >60)
Glucose, Bld: 99 mg/dL (ref 70–99)
Potassium: 2.9 mmol/L — ABNORMAL LOW (ref 3.5–5.1)
Sodium: 131 mmol/L — ABNORMAL LOW (ref 135–145)

## 2020-07-26 LAB — CBC
HCT: 34.3 % — ABNORMAL LOW (ref 39.0–52.0)
Hemoglobin: 12.5 g/dL — ABNORMAL LOW (ref 13.0–17.0)
MCH: 32.7 pg (ref 26.0–34.0)
MCHC: 36.4 g/dL — ABNORMAL HIGH (ref 30.0–36.0)
MCV: 89.8 fL (ref 80.0–100.0)
Platelets: 286 10*3/uL (ref 150–400)
RBC: 3.82 MIL/uL — ABNORMAL LOW (ref 4.22–5.81)
RDW: 12.1 % (ref 11.5–15.5)
WBC: 10.2 10*3/uL (ref 4.0–10.5)
nRBC: 0 % (ref 0.0–0.2)

## 2020-07-26 LAB — SURGICAL PATHOLOGY

## 2020-07-26 LAB — MAGNESIUM: Magnesium: 2.6 mg/dL — ABNORMAL HIGH (ref 1.7–2.4)

## 2020-07-26 LAB — GLUCOSE, CAPILLARY: Glucose-Capillary: 91 mg/dL (ref 70–99)

## 2020-07-26 MED ORDER — INSULIN ASPART 100 UNIT/ML ~~LOC~~ SOLN: 0.0000 [IU] | Freq: Three times a day (TID) | SUBCUTANEOUS | Status: DC

## 2020-07-26 MED ORDER — POTASSIUM CHLORIDE IN NACL 40-0.9 MEQ/L-% IV SOLN
INTRAVENOUS | Status: DC
  Filled 2020-07-26 (×5): qty 1000

## 2020-07-26 MED ORDER — POTASSIUM CHLORIDE 10 MEQ/100ML IV SOLN
10.0000 meq | INTRAVENOUS | Status: AC
  Administered 2020-07-26 (×4): 10 meq via INTRAVENOUS
  Filled 2020-07-26 (×6): qty 100

## 2020-07-26 MED ORDER — SODIUM CHLORIDE 0.9% FLUSH
10.0000 mL | INTRAVENOUS | Status: DC | PRN
Start: 2020-07-26 — End: 2020-07-30
  Administered 2020-07-29: 10 mL

## 2020-07-26 MED ORDER — HYDROMORPHONE HCL 1 MG/ML IJ SOLN
1.0000 mg | INTRAMUSCULAR | Status: DC | PRN
  Administered 2020-07-26 – 2020-07-27 (×3): 1 mg via INTRAVENOUS
  Administered 2020-07-29: 2 mg via INTRAVENOUS
  Filled 2020-07-26 (×3): qty 1
  Filled 2020-07-26: qty 2

## 2020-07-26 MED ORDER — TRAVASOL 10 % IV SOLN
INTRAVENOUS | Status: AC
  Filled 2020-07-26: qty 360

## 2020-07-26 MED ORDER — SODIUM CHLORIDE 0.9% FLUSH
10.0000 mL | Freq: Two times a day (BID) | INTRAVENOUS | Status: DC
  Administered 2020-07-26 – 2020-07-30 (×6): 10 mL

## 2020-07-26 MED ORDER — LACTATED RINGERS IV SOLN: INTRAVENOUS | Status: DC

## 2020-07-26 NOTE — Progress Notes (Addendum)
[facility] SURGICAL PROGRESS NOTE  Hospital Day(s): 2.   Post op day(s): 2 Days Post-Op.   Interval History:  Patient seen and examined No acute events or new complaints overnight.  Patient reports he continues to have incisional soreness, pain medication only helping minimally No fever, chills, nausea, emesis Remains without leukocytosis; WBC 10.2k Renal function mildly improved, sCr - 4.07, UO - 1.9 Hypokalemia to 2.9; Hyponatremia to 131 NGT output recorded at 3.4L; appears more dilute He has remained NPO but suspect he is having lots of ice chips  Vital signs in last 24 hours: [min-max] current  Temp:  [97.4 F (36.3 C)-98.9 F (37.2 C)] 98.9 F (37.2 C) (02/03 0450) Pulse Rate:  [93-107] 93 (02/03 0450) Resp:  [8-24] 18 (02/03 0450) BP: (83-115)/(60-84) 110/76 (02/03 0450) SpO2:  [86 %-98 %] 98 % (02/03 0450) Weight:  [69.7 kg] 69.7 kg (02/03 0500)     Height: 6' (182.9 cm) Weight: 69.7 kg BMI (Calculated): 20.84   Intake/Output last 2 shifts:  02/02 0701 - 02/03 0700 In: 2842.2 [I.V.:2315.1; IV Piggyback:527.2] Out: 5375 [Urine:1900; Emesis/NG output:3475]   Physical Exam:  Constitutional: alert, cooperative and no distress  HEENT: NGT in place; output appears more dilute Respiratory: breathing non-labored at rest  Cardiovascular: regular rate and sinus rhythm  Gastrointestinal: Soft, incisional soreness, non-distended, no rebound/guarding. He does have a reducible and soft ventral hernia present.  Integumentary: Left mid abdomen incision is CDI with staples, no erythema, minimal drainage on dressing  Labs:  CBC Latest Ref Rng & Units 07/26/2020 07/25/2020 07/24/2020  WBC 4.0 - 10.5 K/uL 10.2 11.8(H) 11.2(H)  Hemoglobin 13.0 - 17.0 g/dL 12.5(L) 14.5 16.9  Hematocrit 39.0 - 52.0 % 34.3(L) 39.5 47.2  Platelets 150 - 400 K/uL 286 365 406(H)   CMP Latest Ref Rng & Units 07/26/2020 07/25/2020 07/24/2020  Glucose 70 - 99 mg/dL 99 87 79  BUN 6 - 20 mg/dL 034(V)  425(Z) 563(O)  Creatinine 0.61 - 1.24 mg/dL 7.56(E) 3.32(R) 5.18(A)  Sodium 135 - 145 mmol/L 131(L) 125(L) 125(L)  Potassium 3.5 - 5.1 mmol/L 2.9(L) 3.5 3.6  Chloride 98 - 111 mmol/L 88(L) 76(L) 72(L)  CO2 22 - 32 mmol/L 28 28 26   Calcium 8.9 - 10.3 mg/dL 7.3(L) 7.5(L) 7.5(L)  Total Protein 6.5 - 8.1 g/dL - 6.7 -  Total Bilirubin 0.3 - 1.2 mg/dL - 1.7(H) -  Alkaline Phos 38 - 126 U/L - 46 -  AST 15 - 41 U/L - 25 -  ALT 0 - 44 U/L - 13 -    Imaging studies:  KUB + CXR (07/26/2020) personally reviewed showing expected pneumoperitoneum in setting of recent laparotomy, air fluid filled bowel, gas in colon, and radiologist report reviewed; IMPRESSION: 1. Enteric tube tip and side port project over the stomach. 2. Pneumoperitoneum, consistent with recent laparotomy. No dilated loops of bowel are seen.   Assessment/Plan:  55 y.o. male with likely post-operative ileus 2 Days Post-Op s/p exploratory laparotomy and small bowel resection for strangulated incisional hernia, complicated by, now improving, AKI secondary to likely septic shock   - Recommend continuation of NGT to LIS; monitor and record output             - Reassess need for foley as AKi improves; nephrology following              - Continue NPO             - Aggressive IVF resuscitation             -  Continue IV ABx (Zosyn)             - Monitor abdominal examination; on-going bowel function             - Pain control prn; antiemetics prn             - Mobilization as tolerated; low threshold to engage PT             - Further management per primary service  All of the above findings and recommendations were discussed with the patient*, and the medical team, and all of patient's questions were answered to his expressed satisfaction.  -- Lynden Oxford, PA-C [facility] 07/26/2020, 7:23 AM [phone] M-F: 7am - 4pm

## 2020-07-26 NOTE — Consult Note (Signed)
PHARMACY - TOTAL PARENTERAL NUTRITION CONSULT NOTE   Indication: Small bowel obstruction s/p laporotomy  Patient Measurements: Height: 6' (182.9 cm) Weight: 69.7 kg (153 lb 10.6 oz) IBW/kg (Calculated) : 77.6   Body mass index is 20.84 kg/m. Usual Weight: 69.7 kg  Assessment:  55yo WM with history of seizure d/o, HTN, migraines, asthma, BPH, anxiety/MDD, polysubstance abuse, and lumbar disc disease presenting with acute seizure followed by post-ictal confusion, hypotension (sys 50s), and lower abd'l pain with N/V. Abd/pelvis CT revealed hernia, incarcerated bowel and is now s/p correction with emergent laparotomy. Pt now in SDU, NPO and pharmacy consulted for management of TPN.  Glucose / Insulin: 24-hr trend 87-99 mg/dL Electrolytes: Na 932; K 2.9; Cl 88; Ca 7.3 Renal:  5.61>4.07 (BL 0.76 - 06/14/2020) LFTs / TGs: AST/ALT - 25/13  /  ___ Prealbumin / albumin: ___  /  3.7 Intake / Output: 1.1 ml/k/h (neg 5.8L/admit) MIVF: NS+17meq KCL @100ml /hr  GI Imaging: 2/01 - CT Abd/Pelvis: Spigelian hernia in the left abdominal wall lateral to the rectus muscle with an incarcerated loop of small bowel causing more proximal small bowel and gastric distension. The abdominal wall defect was seen on the prior exam although no herniation was noted at that time. Surgeries / Procedures:  2/01 - ex lap to fix ventral hernia   Central access: To be established / not placed yet TPN start date: 2/03 - 1800  Nutritional Goals (per RD recommendation on 2/03): kCal: 2-2.3, Protein: 100-115, Fluid: 2-2.3 L  Current Nutrition:  NPO and TPN ; ice chips  Plan:  Start TPN at 5mL/hr at 1800 Electrolytes in TPN: 78mEq/L of Na, 11mEq/L of K, 30mEq/L of Ca, 28mEq/L of Mg, and 73mmol/L of Phos. Cl:Ac ratio 1:1    Add standard MVI and trace elements to TPN    Hold lipids for 7-10 days. Initiate Sensitive q6h SSI and adjust as needed  Reduce MIVF to 157mL/hr at 1800 Monitor TPN labs on Mon/Thurs, first  2/04.  Discussed after order in place, but will add thiamine 100mg  x 3 days in TPN for extensive h/o of substance abuse and likely refeed starting 2/04>>    4/04 07/26/2020,12:11 PM

## 2020-07-26 NOTE — Progress Notes (Signed)
Peripherally Inserted Central Catheter Placement  The IV Nurse has discussed with the patient and/or persons authorized to consent for the patient, the purpose of this procedure and the potential benefits and risks involved with this procedure.  The benefits include less needle sticks, lab draws from the catheter, and the patient may be discharged home with the catheter. Risks include, but not limited to, infection, bleeding, blood clot (thrombus formation), and puncture of an artery; nerve damage and irregular heartbeat and possibility to perform a PICC exchange if needed/ordered by physician.  Alternatives to this procedure were also discussed.  Bard Power PICC patient education guide, fact sheet on infection prevention and patient information card has been provided to patient /or left at bedside.    PICC Placement Documentation  PICC Double Lumen 07/26/20 PICC Right Brachial 38 cm 0 cm (Active)  Indication for Insertion or Continuance of Line Administration of hyperosmolar/irritating solutions (i.e. TPN, Vancomycin, etc.) 07/26/20 1518  Exposed Catheter (cm) 0 cm 07/26/20 1518  Site Assessment Clean;Dry;Intact 07/26/20 1518  Lumen #1 Status Blood return noted;Saline locked 07/26/20 1518  Lumen #2 Status Blood return noted;Saline locked 07/26/20 1518  Dressing Type Transparent;Securing device 07/26/20 1518  Dressing Status Dry;Intact 07/26/20 1518  Antimicrobial disc in place? Yes 07/26/20 1518  Safety Lock Not Applicable 07/26/20 1518  Dressing Change Due 08/02/20 07/26/20 1518       Romie Jumper 07/26/2020, 3:21 PM

## 2020-07-26 NOTE — Progress Notes (Signed)
Initial Nutrition Assessment  DOCUMENTATION CODES:   Severe malnutrition in context of social or environmental circumstances  INTERVENTION:   TPN per pharmacy   Recommend $RemoveBe'100mg'[id number]$  thiamine daily added to TPN  Pt at high refeed risk; recommend monitor potassium, magnesium and phosphorus labs daily until stable  NUTRITION DIAGNOSIS:   Severe Malnutrition related to social / environmental circumstances as evidenced by moderate fat depletion,severe fat depletion,percent weight loss,moderate muscle depletion,severe muscle depletion.  GOAL:   Patient will meet greater than or equal to 90% of their needs  MONITOR:   Labs,Weight trends,Skin,I & O's,Other (Comment) (TPN)  REASON FOR ASSESSMENT:   Consult New TPN/TNA  ASSESSMENT:   55 y.o. male with h/o seizure disorder, hypertension, migraine, asthma, BPH, anxiety/depression, polysubstance abuse and lumbar disc disease who is admitted from [facility] with SBO now s/p exploratory laparotomy and small bowel resection for strangulated incisional hernia, complicated by AKI, septic shock and post-op ileus.   Met with pt in room today. Pt reports fairly good appetite and oral intake at baseline but reports that he does not really like the "jail food". RD suspects pt with poor appetite and oral intake at baseline r/t substance abuse. Per chart, pt is down 31lbs(17%) over the past two months; this is severe weight loss. Pt reports that he is aware he has lost a lot of weight but is unsure of how much. Pt currently NPO. NGT in place with 3423ml output; pt has been eating a lot of ice chips. Plan is for PICC line and TPN today; this was discussed with pt. Pt is at high refeed risk.   Medications reviewed and include: heparin, NaCl w/ KCl $Remo'@125ml'KVDql$ /hr, zosyn  Labs reviewed: Na 131(L), K 2.9(L), BUN 106(H), creat 4.07(H)  NUTRITION - FOCUSED PHYSICAL EXAM  Flowsheet Row Most Recent Value  Orbital Region Moderate depletion   Upper Arm Region Severe depletion  Thoracic and Lumbar Region Moderate depletion  Buccal Region Moderate depletion  Temple Region Severe depletion  Clavicle Bone Region Severe depletion  Clavicle and Acromion Bone Region Severe depletion  Scapular Bone Region Moderate depletion  Dorsal Hand Moderate depletion  Patellar Region Severe depletion  Anterior Thigh Region Moderate depletion  Posterior Calf Region Moderate depletion  Edema (RD Assessment) None  Hair Reviewed  Eyes Reviewed  Mouth Reviewed  Skin Reviewed  Nails Reviewed     Diet Order:   Diet Order            Diet NPO time specified  Diet effective now                EDUCATION NEEDS:   Education needs have been addressed  Skin:  Skin Assessment: Reviewed RN Assessment (incision abdomen)  Last BM:  2/3- type 7  Height:   Ht Readings from Last 1 Encounters:  07/24/20 6' (1.829 m)    Weight:   Wt Readings from Last 1 Encounters:  07/26/20 69.7 kg    Ideal Body Weight:  80.9 kg  BMI:  Body mass index is 20.84 kg/m.  Estimated Nutritional Needs:   Kcal:  2000-2300kcal/day  Protein:  100-115g/day  Fluid:  2.0-2.3L/day  Koleen Distance MS, RD, LDN Please refer to [facility] for RD and/or RD on-call/weekend/after hours pager

## 2020-07-26 NOTE — Progress Notes (Signed)
   07/26/20 1755  Assess: MEWS Score  Temp 98.8 F (37.1 C)  BP (!) 87/65  Pulse Rate 99  Resp 20  Level of Consciousness Alert  SpO2 99 %  O2 Device Room Air  Patient Activity (if Appropriate) In bed   Pt arrived to room 222A. Pt AxOx4. Call bell within reach. Bed wheels locked.

## 2020-07-26 NOTE — Progress Notes (Signed)
PROGRESS NOTE    Alan Nelson  ZOX:[id number] DOB: 09/14/1965 DOA: 07/24/2020 PCP: Burnis MedinFulbright, Virginia E, PA-C  Outpatient Specialists: none    Brief Narrative:   Alan Nelson  is a 55 y.o. Caucasian male with a known history of seizure disorder, hypertension and migraine as well as asthma, BPH, anxiety/depression, polysubstance abuse and lumbar disc disease, who presented to the emergency room with acute onset of seizure followed by postictal phase with confusion.  He was found hypotensive with a blood pressure in the 50s systolic, complaining of abdominal pain mainly in the lower abdomen with associated nausea and vomiting.  He admitted to tactile fever and chills.  He has been having mild headache.  His abdominal pain is started on Thursday and got significantly worse today.  No chest pain or palpitations.  No bleeding diathesis.  Upon presentation to the ER, blood pressure was 77/64 with otherwise normal vital signs.  Later respiratory rate was 22 then 29 and with hydration blood pressure was up to 92/77 and later 126/99.  Labs revealed CMP with hyper kalemia, hyponatremia hypochloremia BUN of 102 and creatinine of 5.25 with a calcium of 7 indirect bili of 1.2 and total bili 1.4, high-sensitivity troponin I 145 lactic acid 4.9 with CBC showing leukocytosis of 11.2 with mild neutrophilia and thrombocytosis with INR 1.3 and PT 15.5.  COVID-19 PCR came back negative.  Portable chest ray showed minor bibasilar atelectasis.  Head CT scan revealed no acute abnormalities.  C-spine CT showed no fracture or subluxation.  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 The abdominal wall defect was seen on the prior exam although no herniation was noted at that time.  Tiny nonobstructing left renal calculi.  Changes of prior  splenectomy.  The patient was given IV Zosyn, IV potassium chloride, 3 L bolus of IV normal saline, IV fentanyl and Ativan as well as Zofran followed by infusion of IV normal saline.  Dr. Everlene FarrierPabon was notified about the patient and planned emergent laparotomy.  The patient will be admitted to stepdown unit for further evaluation and management.   Assessment & Plan:   Active Problems:   Bowel obstruction (HCC)  # Incarcerated small bowel hernia # Hypotension # Post-op ileus S/p emergent ex lap with partial small bowel resection last night. NG tube out 3.5 L last 24 hours, KUB today shows persistent ileus - gen surg consulting, appreciate recs - cont npo for now - cont zosyn (2/1> ) - cont IV fluids - hydromorphone, zofran prn - NS @ 125 - TPN ordered, picc to be placed today  # Acute kidney injury # Hyponatremia # Hypokalemia Cr 5s, prior normal. Likely some degree of ATN 2/2 profound hypotension that is now resolved. Cr improved today to 4.07. Hyponatremia improved to 131. K 2.9 in setting of increased NG tube output. Renal u/s unremarkable. - continue IVF, add 40 meq of KCl - f/u mg - nephrology consulted  # Seizure Hx seizure, presented post-ictal, likely precipitated by above acute illness. Post-ictal state has resolved. EEG wnl. Neuro consulted, thinks likely 1/1 hypotension and electrolyte abnormalities  # Opioid dependence - hold home suboxone given severe AKI - hydromorphone prn  # Bipolar - cont home seroquel  # BPH - cont home flomax  DVT prophylaxis: heparin Code Status: full Family Communication: attempted to update daughter Morrie Sheldonashley [phone])  today, no answer  Level of care: Progressive Cardiac Status is: Inpatient  Remains inpatient appropriate because:Inpatient level of care appropriate due to severity of illness   Dispo: The patient is from: [facility]              Anticipated d/c is to: tbd              Anticipated d/c date is: > 3  days              Patient currently is not medically stable to d/c.   Difficult to place patient: potentially        Consultants:  Neurology, nephrology, gen surg  Procedures: 2/2 ex lap with small bowel partial resection  Antimicrobials:  Zosyn 2/1>    Subjective: Complains of hiccups w/ ng tube. Left sided abdominal pain unchanged. No diarrhea. No chest pain.  Objective: Vitals:   07/26/20 0450 07/26/20 0500 07/26/20 0734 07/26/20 1122  BP: 110/76  115/76 100/74  Pulse: 93  91 90  Resp: 18  18 17   Temp: 98.9 F (37.2 C)  98 F (36.7 C) 98 F (36.7 C)  TempSrc: Oral  Oral   SpO2: 98%  99% 98%  Weight:  69.7 kg    Height:        Intake/Output Summary (Last 24 hours) at 07/26/2020 1234 Last data filed at 07/26/2020 0954 Gross per 24 hour  Intake 2842.24 ml  Output 7575 ml  Net -4732.76 ml   Filed Weights   07/25/20 0300 07/26/20 0500  Weight: 73.4 kg 69.7 kg    Examination:  General exam: Appears calm and mildly uncomfortable Respiratory system: Clear to auscultation. Respiratory effort normal. Cardiovascular system: S1 & S2 heard, RRR. No JVD, murmurs, rubs, gallops or clicks. No pedal edema. Gastrointestinal system: abd mldly distended improved from yesterday, ttp upper quadrants, incision c/d/i and dressed Central nervous system: Alert and oriented. No focal neurological deficits. Extremities: Symmetric 5 x 5 power. Skin: No rashes, lesions or ulcers Psychiatry: Judgement and insight appear normal. Mood & affect appropriate.     Data Reviewed: I have personally reviewed following labs and imaging studies  CBC: Recent Labs  Lab 07/24/20 1945 07/25/20 0456 07/26/20 0547  WBC 11.2* 11.8* 10.2  NEUTROABS 9.3* 9.9*  --   HGB 16.9 14.5 12.5*  HCT 47.2 39.5 34.3*  MCV 89.9 89.4 89.8  PLT 406* 365 286   Basic Metabolic Panel: Recent Labs  Lab 07/24/20 1945 07/24/20 2205 07/25/20 0456 07/26/20 0547  NA 127* 125* 125* 131*  K 2.7* 3.6 3.5  2.9*  CL 75* 72* 76* 88*  CO2 22 26 28 28   GLUCOSE 116* 79 87 99  BUN 102* 105* 112* 106*  CREATININE 5.25* 5.83* 5.61* 4.07*  CALCIUM 7.0* 7.5* 7.5* 7.3*  MG  --  2.6*  --   --    GFR: Estimated Creatinine Clearance: 20.9 mL/min (A) (by C-G formula based on SCr of 4.07 mg/dL (H)). Liver Function Tests: Recent Labs  Lab 07/24/20 1945 07/25/20 0456  AST 34 25  ALT 15 13  ALKPHOS 54 46  BILITOT 1.4* 1.7*  PROT 7.4 6.7  ALBUMIN 3.5 3.7   No results for input(s): LIPASE, AMYLASE in the last 168 hours. No results for input(s): AMMONIA in the last 168 hours. Coagulation Profile: Recent Labs  Lab 07/24/20 1945 07/25/20 0456  INR 1.3* 1.4*   Cardiac Enzymes: No results for input(s): CKTOTAL, CKMB, CKMBINDEX, TROPONINI in the last 168 hours.  BNP (last 3 results) No results for input(s): PROBNP in the last 8760 hours. HbA1C: No results for input(s): HGBA1C in the last 72 hours. CBG: Recent Labs  Lab 07/24/20 2011 07/25/20 0257  GLUCAP 115* 92   Lipid Profile: No results for input(s): CHOL, HDL, LDLCALC, TRIG, CHOLHDL, LDLDIRECT in the last 72 hours. Thyroid Function Tests: No results for input(s): TSH, T4TOTAL, FREET4, T3FREE, THYROIDAB in the last 72 hours. Anemia Panel: No results for input(s): VITAMINB12, FOLATE, FERRITIN, TIBC, IRON, RETICCTPCT in the last 72 hours. Urine analysis: No results found for: COLORURINE, APPEARANCEUR, LABSPEC, PHURINE, GLUCOSEU, HGBUR, BILIRUBINUR, KETONESUR, PROTEINUR, UROBILINOGEN, NITRITE, LEUKOCYTESUR Sepsis Labs: @LABRCNTIP (procalcitonin:4,lacticidven:4)  ) Recent Results (from the past 240 hour(s))  SARS Coronavirus 2 by RT PCR (hospital order, performed in [facility] hospital lab) Nasopharyngeal Nasopharyngeal Swab     Status: None   Collection Time: 07/24/20  7:46 PM   Specimen: Nasopharyngeal Swab  Result Value Ref Range Status   SARS Coronavirus 2 NEGATIVE NEGATIVE Final    Comment: (NOTE) SARS-CoV-2 target nucleic acids  are NOT DETECTED.  The SARS-CoV-2 RNA is generally detectable in upper and lower respiratory specimens during the acute phase of infection. The lowest concentration of SARS-CoV-2 viral copies this assay can detect is 250 copies / mL. A negative result does not preclude SARS-CoV-2 infection and should not be used as the sole basis for treatment or other patient management decisions.  A negative result may occur with improper specimen collection / handling, submission of specimen other than nasopharyngeal swab, presence of viral mutation(s) within the areas targeted by this assay, and inadequate number of viral copies (<250 copies / mL). A negative result must be combined with clinical observations, patient history, and epidemiological information.  Fact Sheet for Patients:   09/21/20  Fact Sheet for Healthcare Providers: BoilerBrush.com.cy  This test is not yet approved or  cleared by the https://pope.com/ FDA and has been authorized for detection and/or diagnosis of SARS-CoV-2 by FDA under an Emergency Use Authorization (EUA).  This EUA will remain in effect (meaning this test can be used) for the duration of the COVID-19 declaration under Section 564(b)(1) of the Act, 21 U.S.C. section 360bbb-3(b)(1), unless the authorization is terminated or revoked sooner.  Performed at [facility], [address] [place], [place]   Blood culture (routine x 2)     Status: None (Preliminary result)   Collection Time: 07/24/20 10:05 PM   Specimen: BLOOD  Result Value Ref Range Status   Specimen Description BLOOD LEFT ANTECUBITAL  Final   Special Requests   Final    BOTTLES DRAWN AEROBIC AND ANAEROBIC Blood Culture results may not be optimal due to an inadequate volume of blood received in culture bottles   Culture   Final    NO GROWTH 2 DAYS Performed at [facility], [address] [place], [place]  [place]    Report Status PENDING  Incomplete  Blood culture (routine x 2)     Status: None (Preliminary result)   Collection Time: 07/24/20 10:05 PM   Specimen: BLOOD  Result Value Ref Range Status   Specimen Description BLOOD RIGHT ANTECUBITAL  Final   Special Requests   Final    BOTTLES DRAWN AEROBIC AND ANAEROBIC Blood Culture results may not be optimal due to an inadequate volume of blood received in culture bottles   Culture   Final    NO GROWTH 2 DAYS Performed at [facility], [address]  [place], [place] [address]    Report Status PENDING  Incomplete  MRSA PCR Screening     Status: None   Collection Time: 07/25/20  3:00 AM   Specimen: Nasopharyngeal  Result Value Ref Range Status   MRSA by PCR NEGATIVE NEGATIVE Final    Comment:        The GeneXpert MRSA Assay (FDA approved for NASAL specimens only), is one component of a comprehensive MRSA colonization surveillance program. It is not intended to diagnose MRSA infection nor to guide or monitor treatment for MRSA infections. Performed at [facility], [address] [place], [place] [address]          Radiology Studies: EEG  Result Date: 07/25/2020 Charlsie Quest, MD     07/25/2020  3:51 PM Patient Name: JOSEENRIQUE SIFERS MRN: [id number] Epilepsy Attending: Charlsie Quest Referring Physician/Provider: Dr Ritta Slot Date: 07/24/2020 Duration: 21.07 mins Patient history: 55 year old male with a history of seizures in the setting of Wellbutrin use who presents with reported convulsion in the setting of severe hypotension. EEG to evaluate for seizure Level of alertness: Awake,  asleep AEDs during EEG study: None Technical aspects: This EEG study was done with scalp electrodes positioned according to the 10-20 International system of electrode placement. Electrical activity was acquired at a sampling rate of 500Hz  and reviewed with a high frequency filter of 70Hz  and a low frequency filter of  1Hz . EEG data were recorded continuously and digitally stored. Description: The posterior dominant rhythm consists of 8 Hz activity of moderate voltage (25-35 uV) seen predominantly in posterior head regions, symmetric and reactive to eye opening and eye closing. Sleep was characterized by vertex waves, sleep spindles (12 to 14 Hz), maximal frontocentral region. Hyperventilation and photic stimulation were not performed.   IMPRESSION: This study is within normal limits. No seizures or epileptiform discharges were seen throughout the recording. Charlsie Quest   CT ABDOMEN PELVIS WO CONTRAST  Result Date: 07/24/2020 CLINICAL DATA:  Abdominal distension and bloody stools EXAM: CT ABDOMEN AND PELVIS WITHOUT CONTRAST TECHNIQUE: Multidetector CT imaging of the abdomen and pelvis was performed following the standard protocol without IV contrast. COMPARISON:  07/10/2009 FINDINGS: Lower chest: No acute abnormality. Hepatobiliary: No focal liver abnormality is seen. No gallstones, gallbladder wall thickening, or biliary dilatation. Pancreas: Unremarkable. No pancreatic ductal dilatation or surrounding inflammatory changes. 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 Adrenals/Urinary Tract: Adrenal glands are within normal limits bilaterally. The kidneys are well visualized bilaterally. Tiny nonobstructing left renal stones are noted. The bladder is decompressed. Stomach/Bowel: Scattered diverticular change of the colon is noted without evidence of diverticulitis. Colon is predominately decompressed. The appendix is unremarkable. Stomach is significantly distended with fluid and food stuffs. This extends into the jejunum and is secondary to herniation of the proximal jejunum through a defect in the abdominal wall on the left. There is a hernia identified in the left mid abdomen anteriorly which extends between layers of the abdominal wall musculature. The more distal small  bowel is decompressed. Vascular/Lymphatic: Aortic atherosclerosis. No enlarged abdominal or pelvic lymph nodes. Reproductive: Prostate is unremarkable. Other: No abdominal wall hernia or abnormality. No abdominopelvic ascites. Musculoskeletal: Degenerative changes of lumbar spine are noted. 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 The abdominal wall defect was seen on the prior exam although no  herniation was noted at that time. Tiny nonobstructing left renal calculi. Changes of prior splenectomy. Electronically Signed   By: Alcide Clever M.D.   On: 07/24/2020 21:45   CT Head Wo Contrast  Result Date: 07/24/2020 CLINICAL DATA:  Seizure, nontraumatic (Age >= 41y) EXAM: CT HEAD WITHOUT CONTRAST TECHNIQUE: Contiguous axial images were obtained from the base of the skull through the vertex without intravenous contrast. COMPARISON:  Head CT 11/13/2019 FINDINGS: Brain: Brain volume is normal for age. No intracranial hemorrhage, mass effect, or midline shift. No hydrocephalus. Incidental mega cisterna magna. The basilar cisterns are patent. No evidence of territorial infarct or acute ischemia. No extra-axial or intracranial fluid collection. Vascular: Atherosclerosis of skullbase vasculature without hyperdense vessel or abnormal calcification. Skull: Bilateral parietal craniotomy.  No fracture or focal lesion. Sinuses/Orbits: Paranasal sinuses and mastoid air cells are clear. The visualized orbits are unremarkable. Multiple dental caries are partially included. Other: None. IMPRESSION: No acute intracranial abnormality or explanation for seizure. Electronically Signed   By: Narda Rutherford M.D.   On: 07/24/2020 21:38   CT Cervical Spine Wo Contrast  Result Date: 07/24/2020 CLINICAL DATA:  Neck trauma, focal neuro deficit or paresthesia (Age 5-64y) EXAM: CT CERVICAL SPINE  WITHOUT CONTRAST TECHNIQUE: Multidetector CT imaging of the cervical spine was performed without intravenous contrast. Multiplanar CT image reconstructions were also generated. COMPARISON:  None. FINDINGS: Alignment: Normal. Skull base and vertebrae: No acute fracture. Vertebral body heights are maintained. The dens and skull base are intact. Soft tissues and spinal canal: No prevertebral fluid or swelling. No visible canal hematoma. Disc levels:  Nonacute.  Minor endplate spurring. Upper chest: No acute findings. Other: None. IMPRESSION: No acute fracture or subluxation of the cervical spine. Electronically Signed   By: Narda Rutherford M.D.   On: 07/24/2020 21:40   US RENAL  Result Date: 07/25/2020 CLINICAL DATA:  Acute kidney injury. EXAM: RENAL / URINARY TRACT ULTRASOUND COMPLETE COMPARISON:  CT scan 07/24/2020 FINDINGS: Right Kidney: Renal measurements: 11.5 x 6.9 x 5.5 cm = volume: 226.8 mL. Normal renal cortical thickness and slight increased echogenicity but no worrisome renal lesions or hydronephrosis. Left Kidney: Renal measurements: 9.6 x 5.9 x 5.5 cm = volume: 163.0 mL. Normal renal cortical thickness. Mild diffuse increased echogenicity. No renal lesions or hydronephrosis. Bladder: No mass lesions or calculi.  A Foley catheter is noted. Other: None. IMPRESSION: 1. Normal renal cortical thickness and no renal lesions or hydronephrosis. 2. Mild increased echogenicity of both kidneys. 3. Unremarkable appearance of the bladder. Foley catheter is present. Electronically Signed   By: Rudie Meyer M.D.   On: 07/25/2020 11:57   DG Chest Portable 1 View  Result Date: 07/24/2020 CLINICAL DATA:  Chest pain. EXAM: PORTABLE CHEST 1 VIEW COMPARISON:  Radiograph 11/13/2019 FINDINGS: The cardiomediastinal contours are normal. Minor bibasilar atelectasis. Pulmonary vasculature is normal. No consolidation, pleural effusion, or pneumothorax. Remote left rib fractures. No acute osseous abnormalities are seen.  IMPRESSION: Minor bibasilar atelectasis. Electronically Signed   By: Narda Rutherford M.D.   On: 07/24/2020 20:01   DG ABD ACUTE 2+V W 1V CHEST  Result Date: 07/26/2020 CLINICAL DATA:  Recent surgery EXAM: DG ABDOMEN ACUTE WITH 1 VIEW CHEST COMPARISON:  July 25, 2019 FINDINGS: Cardiomediastinal silhouette is normal in contour. Scattered bibasilar atelectasis. No pleural effusion or pneumothorax. Enteric tube tip and side port project over the stomach. There is pneumoperitoneum, consistent with recent laparotomy. Soft tissue air. Surgical staples. Surgical sutures project over the mid abdomen. Air filled  nondilated loops of bowel. Remote bilateral rib fractures. Status post vertebral augmentation of T12. IMPRESSION: 1. Enteric tube tip and side port project over the stomach. 2. Pneumoperitoneum, consistent with recent laparotomy. No dilated loops of bowel are seen. Electronically Signed   By: Meda Klinefelter MD   On: 07/26/2020 08:24   [place] EKG SITE RITE  Result Date: 07/26/2020 If Site Rite image not attached, placement could not be confirmed due to current cardiac rhythm.       Scheduled Meds: . Chlorhexidine Gluconate Cloth  6 each Topical Q0600  . heparin  5,000 Units Subcutaneous Q8H  . QUEtiapine  200 mg Oral QHS  . tamsulosin  0.8 mg Oral Daily   Continuous Infusions: . lactated ringers 100 mL/hr at 07/26/20 1149  . piperacillin-tazobactam (ZOSYN)  IV 2.25 g (07/26/20 0552)  . sodium chloride Stopped (07/25/20 2019)  . TPN ADULT (ION)       LOS: 2 days    Time spent: 40 min    Silvano Bilis, MD [facility]   If 7PM-7AM, please contact night-coverage [email or web address] Password [facility] 07/26/2020, 12:34 PM

## 2020-07-26 NOTE — Progress Notes (Signed)
[facility] [place], [place] 07/26/20  Subjective:   Hospital day # 2  Doing fair Denies acute c/o NGT in place with greenish drainage Mouth appears dry.  Patient is asking for ice chips Renal: 02/02 0701 - 02/03 0700 In: 2842.2 [I.V.:2315.1; IV Piggyback:527.2] Out: 5375 [Urine:1900; Emesis/NG output:3475] Lab Results  Component Value Date   CREATININE 4.07 (H) 07/26/2020   CREATININE 5.61 (H) 07/25/2020   CREATININE 5.83 (H) 07/24/2020     Objective:  Vital signs in last 24 hours:  Temp:  [97.4 F (36.3 C)-98.9 F (37.2 C)] 98 F (36.7 C) (02/03 0734) Pulse Rate:  [91-106] 91 (02/03 0734) Resp:  [11-24] 18 (02/03 0734) BP: (83-115)/(60-78) 115/76 (02/03 0734) SpO2:  [86 %-99 %] 99 % (02/03 0734) Weight:  [69.7 kg] 69.7 kg (02/03 0500)  Weight change: -3.7 kg Filed Weights   07/25/20 0300 07/26/20 0500  Weight: 73.4 kg 69.7 kg    Intake/Output:    Intake/Output Summary (Last 24 hours) at 07/26/2020 1109 Last data filed at 07/26/2020 0954 Gross per 24 hour  Intake 2842.24 ml  Output 7575 ml  Net -4732.76 ml   Physical Exam: General:  No acute distress, laying in the bed  HEENT  anicteric, dry oral mucous membrane, NGT in place  Pulm/lungs  normal breathing effort, lungs are clear to auscultation  CVS/Heart  regular rhythm, no rub or gallop  Abdomen:   Soft, surgical dressing in place, clean  Extremities:  No peripheral edema  Neurologic:  Alert, oriented, able to follow commands  Skin:  No acute rashes  Foley in place with clear yellow urine   Basic Metabolic Panel:  Recent Labs  Lab 07/24/20 1945 07/24/20 2205 07/25/20 0456 07/26/20 0547  NA 127* 125* 125* 131*  K 2.7* 3.6 3.5 2.9*  CL 75* 72* 76* 88*  CO2 22 26 28 28   GLUCOSE 116* 79 87 99  BUN 102* 105* 112* 106*  CREATININE 5.25* 5.83* 5.61* 4.07*  CALCIUM 7.0* 7.5* 7.5* 7.3*  MG  --  2.6*  --   --      CBC: Recent Labs  Lab 07/24/20 1945 07/25/20 0456  07/26/20 0547  WBC 11.2* 11.8* 10.2  NEUTROABS 9.3* 9.9*  --   HGB 16.9 14.5 12.5*  HCT 47.2 39.5 34.3*  MCV 89.9 89.4 89.8  PLT 406* 365 286     No results found for: HEPBSAG, HEPBSAB, HEPBIGM    Microbiology:  Recent Results (from the past 240 hour(s))  SARS Coronavirus 2 by RT PCR (hospital order, performed in [facility] hospital lab) Nasopharyngeal Nasopharyngeal Swab     Status: None   Collection Time: 07/24/20  7:46 PM   Specimen: Nasopharyngeal Swab  Result Value Ref Range Status   SARS Coronavirus 2 NEGATIVE NEGATIVE Final    Comment: (NOTE) SARS-CoV-2 target nucleic acids are NOT DETECTED.  The SARS-CoV-2 RNA is generally detectable in upper and lower respiratory specimens during the acute phase of infection. The lowest concentration of SARS-CoV-2 viral copies this assay can detect is 250 copies / mL. A negative result does not preclude SARS-CoV-2 infection and should not be used as the sole basis for treatment or other patient management decisions.  A negative result may occur with improper specimen collection / handling, submission of specimen other than nasopharyngeal swab, presence of viral mutation(s) within the areas targeted by this assay, and inadequate number of viral copies (<250 copies / mL). A negative result must be combined with clinical observations, patient  history, and epidemiological information.  Fact Sheet for Patients:   BoilerBrush.com.cy  Fact Sheet for Healthcare Providers: https://pope.com/  This test is not yet approved or  cleared by the [place] FDA and has been authorized for detection and/or diagnosis of SARS-CoV-2 by FDA under an Emergency Use Authorization (EUA).  This EUA will remain in effect (meaning this test can be used) for the duration of the COVID-19 declaration under Section 564(b)(1) of the Act, 21 U.S.C. section 360bbb-3(b)(1), unless the authorization is terminated  or revoked sooner.  Performed at [facility], [address] [place], [place] [address]   Blood culture (routine x 2)     Status: None (Preliminary result)   Collection Time: 07/24/20 10:05 PM   Specimen: BLOOD  Result Value Ref Range Status   Specimen Description BLOOD LEFT ANTECUBITAL  Final   Special Requests   Final    BOTTLES DRAWN AEROBIC AND ANAEROBIC Blood Culture results may not be optimal due to an inadequate volume of blood received in culture bottles   Culture   Final    NO GROWTH 2 DAYS Performed at [facility], [address] [place], [place] [address]    Report Status PENDING  Incomplete  Blood culture (routine x 2)     Status: None (Preliminary result)   Collection Time: 07/24/20 10:05 PM   Specimen: BLOOD  Result Value Ref Range Status   Specimen Description BLOOD RIGHT ANTECUBITAL  Final   Special Requests   Final    BOTTLES DRAWN AEROBIC AND ANAEROBIC Blood Culture results may not be optimal due to an inadequate volume of blood received in culture bottles   Culture   Final    NO GROWTH 2 DAYS Performed at [facility], [address], [place], [place] [address]    Report Status PENDING  Incomplete  MRSA PCR Screening     Status: None   Collection Time: 07/25/20  3:00 AM   Specimen: Nasopharyngeal  Result Value Ref Range Status   MRSA by PCR NEGATIVE NEGATIVE Final    Comment:        The GeneXpert MRSA Assay (FDA approved for NASAL specimens only), is one component of a comprehensive MRSA colonization surveillance program. It is not intended to diagnose MRSA infection nor to guide or monitor treatment for MRSA infections. Performed at [facility], [address] [place], [place] [address]     Coagulation Studies: Recent Labs    07/24/20 1945 07/25/20 0456  LABPROT 15.5* 16.5*  INR 1.3* 1.4*    Urinalysis: No results for input(s): COLORURINE, LABSPEC, PHURINE, GLUCOSEU, HGBUR, BILIRUBINUR,  KETONESUR, PROTEINUR, UROBILINOGEN, NITRITE, LEUKOCYTESUR in the last 72 hours.  Invalid input(s): APPERANCEUR    Imaging: EEG  Result Date: 07/25/2020 Charlsie Quest, MD     07/25/2020  3:51 PM Patient Name: Alan Nelson MRN: [id number] Epilepsy Attending: Charlsie Quest Referring Physician/Provider: Dr Ritta Slot Date: 07/24/2020 Duration: 21.07 mins Patient history: 55 year old male with a history of seizures in the setting of Wellbutrin use who presents with reported convulsion in the setting of severe hypotension. EEG to evaluate for seizure Level of alertness: Awake,  asleep AEDs during EEG study: None Technical aspects: This EEG study was done with scalp electrodes positioned according to the 10-20 International system of electrode placement. Electrical activity was acquired at a sampling rate of 500Hz  and reviewed with a high frequency filter of 70Hz  and a low frequency filter of 1Hz . EEG data were recorded  continuously and digitally stored. Description: The posterior dominant rhythm consists of 8 Hz activity of moderate voltage (25-35 uV) seen predominantly in posterior head regions, symmetric and reactive to eye opening and eye closing. Sleep was characterized by vertex waves, sleep spindles (12 to 14 Hz), maximal frontocentral region. Hyperventilation and photic stimulation were not performed.   IMPRESSION: This study is within normal limits. No seizures or epileptiform discharges were seen throughout the recording. Charlsie Quest. Priyanka O Yadav   CT ABDOMEN PELVIS WO CONTRAST  Result Date: 07/24/2020 CLINICAL DATA:  Abdominal distension and bloody stools EXAM: CT ABDOMEN AND PELVIS WITHOUT CONTRAST TECHNIQUE: Multidetector CT imaging of the abdomen and pelvis was performed following the standard protocol without IV contrast. COMPARISON:  07/10/2009 FINDINGS: Lower chest: No acute abnormality. Hepatobiliary: No focal liver abnormality is seen. No gallstones, gallbladder wall thickening, or  biliary dilatation. Pancreas: Unremarkable. No pancreatic ductal dilatation or surrounding inflammatory changes. 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 Adrenals/Urinary Tract: Adrenal glands are within normal limits bilaterally. The kidneys are well visualized bilaterally. Tiny nonobstructing left renal stones are noted. The bladder is decompressed. Stomach/Bowel: Scattered diverticular change of the colon is noted without evidence of diverticulitis. Colon is predominately decompressed. The appendix is unremarkable. Stomach is significantly distended with fluid and food stuffs. This extends into the jejunum and is secondary to herniation of the proximal jejunum through a defect in the abdominal wall on the left. There is a hernia identified in the left mid abdomen anteriorly which extends between layers of the abdominal wall musculature. The more distal small bowel is decompressed. Vascular/Lymphatic: Aortic atherosclerosis. No enlarged abdominal or pelvic lymph nodes. Reproductive: Prostate is unremarkable. Other: No abdominal wall hernia or abnormality. No abdominopelvic ascites. Musculoskeletal: Degenerative changes of lumbar spine are noted. 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 The abdominal wall defect was seen on the prior exam although no herniation was noted at that time. Tiny nonobstructing left renal calculi. Changes of prior splenectomy. Electronically Signed   By: Alcide CleverMark  Lukens M.D.   On: 07/24/2020 21:45   CT Head Wo Contrast  Result Date: 07/24/2020 CLINICAL DATA:  Seizure, nontraumatic (Age >= 41y) EXAM: CT HEAD WITHOUT CONTRAST TECHNIQUE: Contiguous axial images were obtained from the base of the skull through the vertex without intravenous contrast. COMPARISON:  Head CT 11/13/2019 FINDINGS: Brain: Brain  volume is normal for age. No intracranial hemorrhage, mass effect, or midline shift. No hydrocephalus. Incidental mega cisterna magna. The basilar cisterns are patent. No evidence of territorial infarct or acute ischemia. No extra-axial or intracranial fluid collection. Vascular: Atherosclerosis of skullbase vasculature without hyperdense vessel or abnormal calcification. Skull: Bilateral parietal craniotomy.  No fracture or focal lesion. Sinuses/Orbits: Paranasal sinuses and mastoid air cells are clear. The visualized orbits are unremarkable. Multiple dental caries are partially included. Other: None. IMPRESSION: No acute intracranial abnormality or explanation for seizure. Electronically Signed   By: Narda RutherfordMelanie  Sanford M.D.   On: 07/24/2020 21:38   CT Cervical Spine Wo Contrast  Result Date: 07/24/2020 CLINICAL DATA:  Neck trauma, focal neuro deficit or paresthesia (Age 2-64y) EXAM: CT CERVICAL SPINE WITHOUT CONTRAST TECHNIQUE: Multidetector CT imaging of the cervical spine was performed without intravenous contrast. Multiplanar CT image reconstructions were also generated. COMPARISON:  None. FINDINGS: Alignment: Normal. Skull base and vertebrae: No acute fracture. Vertebral body heights are maintained.  The dens and skull base are intact. Soft tissues and spinal canal: No prevertebral fluid or swelling. No visible canal hematoma. Disc levels:  Nonacute.  Minor endplate spurring. Upper chest: No acute findings. Other: None. IMPRESSION: No acute fracture or subluxation of the cervical spine. Electronically Signed   By: Narda Rutherford M.D.   On: 07/24/2020 21:40   US RENAL  Result Date: 07/25/2020 CLINICAL DATA:  Acute kidney injury. EXAM: RENAL / URINARY TRACT ULTRASOUND COMPLETE COMPARISON:  CT scan 07/24/2020 FINDINGS: Right Kidney: Renal measurements: 11.5 x 6.9 x 5.5 cm = volume: 226.8 mL. Normal renal cortical thickness and slight increased echogenicity but no worrisome renal lesions or hydronephrosis.  Left Kidney: Renal measurements: 9.6 x 5.9 x 5.5 cm = volume: 163.0 mL. Normal renal cortical thickness. Mild diffuse increased echogenicity. No renal lesions or hydronephrosis. Bladder: No mass lesions or calculi.  A Foley catheter is noted. Other: None. IMPRESSION: 1. Normal renal cortical thickness and no renal lesions or hydronephrosis. 2. Mild increased echogenicity of both kidneys. 3. Unremarkable appearance of the bladder. Foley catheter is present. Electronically Signed   By: Rudie Meyer M.D.   On: 07/25/2020 11:57   DG Chest Portable 1 View  Result Date: 07/24/2020 CLINICAL DATA:  Chest pain. EXAM: PORTABLE CHEST 1 VIEW COMPARISON:  Radiograph 11/13/2019 FINDINGS: The cardiomediastinal contours are normal. Minor bibasilar atelectasis. Pulmonary vasculature is normal. No consolidation, pleural effusion, or pneumothorax. Remote left rib fractures. No acute osseous abnormalities are seen. IMPRESSION: Minor bibasilar atelectasis. Electronically Signed   By: Narda Rutherford M.D.   On: 07/24/2020 20:01   DG ABD ACUTE 2+V W 1V CHEST  Result Date: 07/26/2020 CLINICAL DATA:  Recent surgery EXAM: DG ABDOMEN ACUTE WITH 1 VIEW CHEST COMPARISON:  July 25, 2019 FINDINGS: Cardiomediastinal silhouette is normal in contour. Scattered bibasilar atelectasis. No pleural effusion or pneumothorax. Enteric tube tip and side port project over the stomach. There is pneumoperitoneum, consistent with recent laparotomy. Soft tissue air. Surgical staples. Surgical sutures project over the mid abdomen. Air filled nondilated loops of bowel. Remote bilateral rib fractures. Status post vertebral augmentation of T12. IMPRESSION: 1. Enteric tube tip and side port project over the stomach. 2. Pneumoperitoneum, consistent with recent laparotomy. No dilated loops of bowel are seen. Electronically Signed   By: Meda Klinefelter MD   On: 07/26/2020 08:24   [place] EKG SITE RITE  Result Date: 07/26/2020 If Site Rite image not attached,  placement could not be confirmed due to current cardiac rhythm.    Medications:   . lactated ringers    . piperacillin-tazobactam (ZOSYN)  IV 2.25 g (07/26/20 0552)  . sodium chloride Stopped (07/25/20 2019)   . Chlorhexidine Gluconate Cloth  6 each Topical Q0600  . heparin  5,000 Units Subcutaneous Q8H  . QUEtiapine  200 mg Oral QHS  . tamsulosin  0.8 mg Oral Daily   fentaNYL (SUBLIMAZE) injection, LORazepam, ondansetron **OR** ondansetron (ZOFRAN) IV, ondansetron  Assessment/ Plan:  55 y.o. male with known history of seizure disorder, hypertension, migraine, asthma, BPH, anxiety, depression, polysubstance abuse, lumbar disc disease    admitted on 07/24/2020 for Bowel obstruction (HCC) [K56.609] AKI (acute kidney injury) (HCC) [N17.9] Hypotension, unspecified hypotension type [I95.9] Acute renal failure, unspecified acute renal failure type (HCC) [N17.9] Non-recurrent abdominal hernia with obstruction without gangrene, unspecified hernia type [K46.0]  # AKI Baseline Creatinine of 0.76 on Jun 14, 2020 Admitted with high Creatinine Will obtain u/a Imaging: CT abdomen without contrast 07/24/2020- tiny non obstructing stones,  bladder decompressed Patient was hypotensive on admission at 77/64 AKI likely due to ATN from hypotension and volume depletion  02/02 0701 - 02/03 0700 In: 2842.2 [I.V.:2315.1; IV Piggyback:527.2] Out: 5375 [Urine:1900; Emesis/NG output:3475]  Lab Results  Component Value Date   CREATININE 4.07 (H) 07/26/2020   CREATININE 5.61 (H) 07/25/2020   CREATININE 5.83 (H) 07/24/2020    # 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    LOS: 2 Alan Nelson Thedore Mins 2/3/202211:09 AM  [facility] [place], [place] [phone]  Note: This note was prepared with Dragon dictation. Any transcription errors are unintentional

## 2020-07-27 ENCOUNTER — Encounter: Payer: Self-pay | Admitting: Surgery

## 2020-07-27 ENCOUNTER — Inpatient Hospital Stay

## 2020-07-27 DIAGNOSIS — K56601 Complete intestinal obstruction, unspecified as to cause: Secondary | ICD-10-CM | POA: Diagnosis not present

## 2020-07-27 LAB — MAGNESIUM: Magnesium: 2.8 mg/dL — ABNORMAL HIGH (ref 1.7–2.4)

## 2020-07-27 LAB — CBC
HCT: 29 % — ABNORMAL LOW (ref 39.0–52.0)
Hemoglobin: 10.2 g/dL — ABNORMAL LOW (ref 13.0–17.0)
MCH: 33.1 pg (ref 26.0–34.0)
MCHC: 35.2 g/dL (ref 30.0–36.0)
MCV: 94.2 fL (ref 80.0–100.0)
Platelets: 267 10*3/uL (ref 150–400)
RBC: 3.08 MIL/uL — ABNORMAL LOW (ref 4.22–5.81)
RDW: 12.5 % (ref 11.5–15.5)
WBC: 9.5 10*3/uL (ref 4.0–10.5)
nRBC: 0 % (ref 0.0–0.2)

## 2020-07-27 LAB — DIFFERENTIAL
Abs Immature Granulocytes: 0.06 10*3/uL (ref 0.00–0.07)
Basophils Absolute: 0 10*3/uL (ref 0.0–0.1)
Basophils Relative: 0 %
Eosinophils Absolute: 0 10*3/uL (ref 0.0–0.5)
Eosinophils Relative: 0 %
Immature Granulocytes: 1 %
Lymphocytes Relative: 12 %
Lymphs Abs: 1.1 10*3/uL (ref 0.7–4.0)
Monocytes Absolute: 0.9 10*3/uL (ref 0.1–1.0)
Monocytes Relative: 10 %
Neutro Abs: 7.3 10*3/uL (ref 1.7–7.7)
Neutrophils Relative %: 77 %

## 2020-07-27 LAB — COMPREHENSIVE METABOLIC PANEL
ALT: 14 U/L (ref 0–44)
AST: 24 U/L (ref 15–41)
Albumin: 2.7 g/dL — ABNORMAL LOW (ref 3.5–5.0)
Alkaline Phosphatase: 36 U/L — ABNORMAL LOW (ref 38–126)
Anion gap: 9 (ref 5–15)
BUN: 47 mg/dL — ABNORMAL HIGH (ref 6–20)
CO2: 28 mmol/L (ref 22–32)
Calcium: 7.8 mg/dL — ABNORMAL LOW (ref 8.9–10.3)
Chloride: 97 mmol/L — ABNORMAL LOW (ref 98–111)
Creatinine, Ser: 1.39 mg/dL — ABNORMAL HIGH (ref 0.61–1.24)
GFR, Estimated: >60 mL/min (ref >60)
Glucose, Bld: 108 mg/dL — ABNORMAL HIGH (ref 70–99)
Potassium: 3.7 mmol/L (ref 3.5–5.1)
Sodium: 134 mmol/L — ABNORMAL LOW (ref 135–145)
Total Bilirubin: 0.8 mg/dL (ref 0.3–1.2)
Total Protein: 5.8 g/dL — ABNORMAL LOW (ref 6.5–8.1)

## 2020-07-27 LAB — TRIGLYCERIDES: Triglycerides: 132 mg/dL (ref <150)

## 2020-07-27 LAB — GLUCOSE, CAPILLARY
Glucose-Capillary: 109 mg/dL — ABNORMAL HIGH (ref 70–99)
Glucose-Capillary: 100 mg/dL — ABNORMAL HIGH (ref 70–99)
Glucose-Capillary: 104 mg/dL — ABNORMAL HIGH (ref 70–99)

## 2020-07-27 LAB — PREALBUMIN: Prealbumin: 11.8 mg/dL — ABNORMAL LOW (ref 18–38)

## 2020-07-27 LAB — PHOSPHORUS: Phosphorus: 2 mg/dL — ABNORMAL LOW (ref 2.5–4.6)

## 2020-07-27 MED ORDER — TRAVASOL 10 % IV SOLN
INTRAVENOUS | Status: DC
  Filled 2020-07-27: qty 720

## 2020-07-27 MED ORDER — BUPRENORPHINE HCL-NALOXONE HCL 8-2 MG SL SUBL
1.0000 | SUBLINGUAL_TABLET | Freq: Two times a day (BID) | SUBLINGUAL | Status: DC
  Administered 2020-07-27 – 2020-07-30 (×7): 1 via SUBLINGUAL
  Filled 2020-07-27 (×7): qty 1

## 2020-07-27 MED ORDER — SODIUM CHLORIDE 0.9 % IV SOLN: INTRAVENOUS | Status: DC

## 2020-07-27 MED ORDER — POTASSIUM PHOSPHATES 15 MMOLE/5ML IV SOLN
15.0000 mmol | Freq: Once | INTRAVENOUS | Status: AC
  Administered 2020-07-27: 15 mmol via INTRAVENOUS
  Filled 2020-07-27: qty 5

## 2020-07-27 MED ORDER — PIPERACILLIN-TAZOBACTAM 3.375 G IVPB
3.3750 g | Freq: Three times a day (TID) | INTRAVENOUS | Status: DC
  Administered 2020-07-27 – 2020-07-30 (×9): 3.375 g via INTRAVENOUS
  Filled 2020-07-27 (×9): qty 50

## 2020-07-27 NOTE — Progress Notes (Signed)
Order received  from Dr. Ashok Pall to Discontinue Foley catheter.

## 2020-07-27 NOTE — Progress Notes (Addendum)
[facility] SURGICAL PROGRESS NOTE  Hospital Day(s): 3.   Post op day(s): 3 Days Post-Op.   Interval History:  Patient seen and examined No acute events or new complaints overnight.  Patient overall looks better this morning. He continues to have incisional soreness but mild improvement in this He does have occasional nausea but he believes this is related to NGT No fever, chills, emesis Labs remain pending this morning UO - 3.5L NGT output recorded at 1.1L; this is more dilute appears. He endorses having "more than 7-8 cups of ice chips a day" Started on TPN yesterday (02/03) He has reported passing flatus in the last 24 hours   Vital signs in last 24 hours: [min-max] current  Temp:  [98 F (36.7 C)-98.9 F (37.2 C)] 98 F (36.7 C) (02/04 0358) Pulse Rate:  [77-99] 77 (02/04 0358) Resp:  [17-20] 20 (02/04 0358) BP: (87-117)/(59-80) 93/59 (02/04 0358) SpO2:  [93 %-99 %] 95 % (02/04 0358)     Height: 6' (182.9 cm) Weight: 69.7 kg BMI (Calculated): 20.84   Intake/Output last 2 shifts:  02/03 0701 - 02/04 0700 In: 230 [P.O.:30; I.V.:200] Out: 4701 [Urine:3550; Emesis/NG output:1150; Stool:1]   Physical Exam:  Constitutional: alert, cooperative and no distress  HEENT: NGT in place; output appears dilute Respiratory: breathing non-labored at rest  Cardiovascular: regular rate and sinus rhythm  Gastrointestinal: Soft, incisional soreness improving, non-distended, no rebound/guarding. He does have a reducible and soft ventral hernia present.  Integumentary: Left mid abdomen incision is CDI with staples, no erythema, minimal drainage on dressing  Labs:  CBC Latest Ref Rng & Units 07/27/2020 07/26/2020 07/25/2020  WBC 4.0 - 10.5 K/uL 9.5 10.2 11.8(H)  Hemoglobin 13.0 - 17.0 g/dL 10.2(L) 12.5(L) 14.5  Hematocrit 39.0 - 52.0 % 29.0(L) 34.3(L) 39.5  Platelets 150 - 400 K/uL 267 286 365   CMP Latest Ref Rng & Units 07/26/2020 07/25/2020 07/24/2020  Glucose 70 - 99 mg/dL 99  87 79  BUN 6 - 20 mg/dL 568(L) 275(T) 700(F)  Creatinine 0.61 - 1.24 mg/dL 7.49(S) 4.96(P) 5.91(M)  Sodium 135 - 145 mmol/L 131(L) 125(L) 125(L)  Potassium 3.5 - 5.1 mmol/L 2.9(L) 3.5 3.6  Chloride 98 - 111 mmol/L 88(L) 76(L) 72(L)  CO2 22 - 32 mmol/L 28 28 26   Calcium 8.9 - 10.3 mg/dL 7.3(L) 7.5(L) 7.5(L)  Total Protein 6.5 - 8.1 g/dL - 6.7 -  Total Bilirubin 0.3 - 1.2 mg/dL - 1.7(H) -  Alkaline Phos 38 - 126 U/L - 46 -  AST 15 - 41 U/L - 25 -  ALT 0 - 44 U/L - 13 -    Imaging studies:   KUB + CXR (07/27/2020) personally reviewed with air seen throughout the colon, mild gastric dilation, improved small bowel dilation in left mid abdomen, and radiologist report reviewed:  IMPRESSION: Nasogastric tube in appropriate position.  Decreasing pneumoperitoneum.  Mild ileus.   Assessment/Plan:  55 y.o. male with clinically improving post-operative ileus 3 Days Post-Op s/p exploratory laparotomy and small bowel resectionfor strangulated incisional hernia, complicated by, now improving, AKI secondary to likely septic shock   - Given his return of bowel function and very dilute NGT output (secondary to ice chip consumption), I think we can attempt NGT clamping trial today. Will clamp NGT x4 hours and check residuals. If residuals are less than 150 ccs, we can consider removal.    - Continue NPO + TPN for now; follow up electrolytes from AM  - reassess foley need; good UO;  nephrology following secondary to AKI - Continue IV ABx (Zosyn) - Monitor abdominal examination; on-going bowel function  - Serial KUBs prn - Pain control prn; antiemetics prn - Mobilization as tolerated; low threshold to engage PT - Further management per primary service; we will follow   All of the above findings and recommendations were discussed with the patient, and the medical team, and all of patient's questions were answered to his expressed  satisfaction.  -- Lynden Oxford, PA-C [facility] 07/27/2020, 7:32 AM [phone] M-F: 7am - 4pm

## 2020-07-27 NOTE — Evaluation (Signed)
Physical Therapy Evaluation Patient Details Name: Alan Nelson MRN: [id number] DOB: Oct 23, 1965 Today's Date: 07/27/2020   History of Present Illness  PMHx significant for seizure disorder, HTN, migraine, polysubstance abuse, and lumbar disc disease. Pt presented to ER with acute onset of seizure followed by postictal phase with confusion. PT was hTN in the 50s systolic. He is now s/p emergent ex lap with partial small bowel resection. NG tube in place, guard present at bedside.  Clinical Impression  The pt presents this session with hip flexor weakness bilaterally which may be 2/2 incisional pain. The pt demonstrates good LE strength elsewhere. EOB/OOB mobility unable to be completed 2/2 inability to remove pt's shackles for mobility. PT will continue to follow in order to progress mobility and complete balance and gait assessment for appropriate d/c recommendations.     Follow Up Recommendations Other (comment) (unable to assess 2/2 limited examination)    Equipment Recommendations       Recommendations for Other Services       Precautions / Restrictions Precautions Precautions: Fall Restrictions Weight Bearing Restrictions: No      Mobility  Bed Mobility Overal bed mobility:  (Unable to assess.)                  Transfers                    Ambulation/Gait                Stairs            Wheelchair Mobility    Modified Rankin (Stroke Patients Only)       Balance                                             Pertinent Vitals/Pain Pain Assessment: 0-10 Pain Score: 4  Pain Location: abdomen Pain Descriptors / Indicators: Sore Pain Intervention(s): Monitored during session    Home Living Family/patient expects to be discharged to:: [facility]                      Prior Function Level of Independence: Independent               Hand Dominance   Dominant Hand: Right    Extremity/Trunk  Assessment   Upper Extremity Assessment Upper Extremity Assessment: RUE deficits/detail;LUE deficits/detail RUE Deficits / Details: Limited shoulder flexion. Pt reporting rotator cuff injuries. WNL elbow flexor/extensor RUE Sensation: WNL RUE Coordination: WNL LUE Deficits / Details: Limited shoulder flexion. Pt reporting RTC injury. L more limited than R. LUE Sensation: WNL LUE Coordination: WNL    Lower Extremity Assessment Lower Extremity Assessment: RLE deficits/detail;LLE deficits/detail RLE Deficits / Details: Hip flexors limited to 3/5 reports of abdominal pain WNL all other joints RLE: Unable to fully assess due to pain LLE Deficits / Details: Hip flexors limited to 3/5 reports of abdominal pain, WNL all other joints       Communication   Communication: No difficulties  Cognition Arousal/Alertness: Awake/alert Behavior During Therapy: WFL for tasks assessed/performed Overall Cognitive Status: Within Functional Limits for tasks assessed                                 General Comments: A&Ox4      General Comments  Exercises     Assessment/Plan    PT Assessment Patient needs continued PT services  PT Problem List Decreased strength;Decreased mobility       PT Treatment Interventions Gait training;Functional mobility training;Therapeutic activities    PT Goals (Current goals can be found in the Care Plan section)  Acute Rehab PT Goals Patient Stated Goal: to return to PLOF PT Goal Formulation: With patient Time For Goal Achievement: 08/10/20 Potential to Achieve Goals: Good    Frequency Min 2X/week   Barriers to discharge        Co-evaluation               AM-PAC PT "6 Clicks" Mobility  Outcome Measure Help needed turning from your back to your side while in a flat bed without using bedrails?: A Little Help needed moving from lying on your back to sitting on the side of a flat bed without using bedrails?: A Little Help needed  moving to and from a bed to a chair (including a wheelchair)?: A Little Help needed standing up from a chair using your arms (e.g., wheelchair or bedside chair)?: A Little Help needed to walk in hospital room?: A Little Help needed climbing 3-5 steps with a railing? : A Lot 6 Click Score: 17    End of Session   Activity Tolerance: Patient tolerated treatment well Patient left: in bed;with family/visitor present Nurse Communication: Mobility status PT Visit Diagnosis: Pain;Muscle weakness (generalized) (M62.81) Pain - part of body:  (abdomen)    Time: 1157-2620 PT Time Calculation (min) (ACUTE ONLY): 20 min   Charges:   PT Evaluation $PT Eval Moderate Complexity: 1 Mod          3:02 PM, 07/27/20 Dailee Manalang A. Mordecai Maes PT, DPT Physical Therapist -  [facility]   Tenia Goh A Pegi Milazzo 07/27/2020, 3:00 PM

## 2020-07-27 NOTE — Progress Notes (Signed)
PROGRESS NOTE    Alan Nelson  GQQ:[id number] DOB: 03/06/1966 DOA: 07/24/2020 PCP: Burnis Medin, PA-C  Outpatient Specialists: none    Brief Narrative:   Alan Nelson  is a 55 y.o. Caucasian male with a known history of seizure disorder, hypertension and migraine as well as asthma, BPH, anxiety/depression, polysubstance abuse and lumbar disc disease, who presented to the emergency room with acute onset of seizure followed by postictal phase with confusion.  He was found hypotensive with a blood pressure in the 50s systolic, complaining of abdominal pain mainly in the lower abdomen with associated nausea and vomiting.  He admitted to tactile fever and chills.  He has been having mild headache.  His abdominal pain is started on Thursday and got significantly worse today.  No chest pain or palpitations.  No bleeding diathesis.  Upon presentation to the ER, blood pressure was 77/64 with otherwise normal vital signs.  Later respiratory rate was 22 then 29 and with hydration blood pressure was up to 92/77 and later 126/99.  Labs revealed CMP with hyper kalemia, hyponatremia hypochloremia BUN of 102 and creatinine of 5.25 with a calcium of 7 indirect bili of 1.2 and total bili 1.4, high-sensitivity troponin I 145 lactic acid 4.9 with CBC showing leukocytosis of 11.2 with mild neutrophilia and thrombocytosis with INR 1.3 and PT 15.5.  COVID-19 PCR came back negative.  Portable chest ray showed minor bibasilar atelectasis.  Head CT scan revealed no acute abnormalities.  C-spine CT showed no fracture or subluxation.    Assessment & Plan:   Active Problems:   Bowel obstruction (HCC)   Protein-calorie malnutrition, severe  # Incarcerated small bowel hernia # Hypotension # Post-op ileus # Melena S/p emergent ex lap with partial small bowel resection 2/2. NG tube out less last night. Had melanotic stool this morning, H 10.2 - cont npo for now, ng tube clamped per gen surg, if passes clamp  trial will attempt to advance diet - cont zosyn (2/1> ) - cont IV fluids - hydromorphone, zofran prn - NS @ 125 for now - TPN ordered, has picc - trend h/h  # Acute kidney injury # Hyponatremia # Hypokalemia Cr 5s on arrival, prior normal. Likely some degree of ATN 2/2 profound hypotension that is now resolved. Cr improved today to 1.53. Hyponatremia improved to 134. K 3.7. Renal u/s unremarkable. - continue IVF w/ 40 meq of KCl - nephrology following  # Seizure Hx seizure, presented post-ictal, likely precipitated by above acute illness. Post-ictal state has resolved. EEG wnl. Neuro consulted, thinks likely 1/1 hypotension and electrolyte abnormalities. No further seizure-like activity  # Opioid dependence - will re-start home suboxone given improved kidney function - hydromorphone prn  # Bipolar - cont home seroquel when tolerating po  # BPH - cont home flomax  DVT prophylaxis: heparin Code Status: full Family Communication: attempted to update daughter Morrie Sheldon [phone]) 2/3, no answer  Level of care: Med-Surg Status is: Inpatient  Remains inpatient appropriate because:Inpatient level of care appropriate due to severity of illness   Dispo: The patient is from: [facility]              Anticipated d/c is to: tbd              Anticipated d/c date is: 2 or more days              Patient currently is not medically stable to d/c.   Difficult to place patient: potentially  Consultants:  Neurology, nephrology, gen surg  Procedures: 2/2 ex lap with small bowel partial resection  Antimicrobials:  Zosyn 2/1>    Subjective: Feeling improved. Left sided abd pain slightly improved. Has appetite. No chest pain or dyspnea. Melanotic stool this AM  Objective: Vitals:   07/26/20 2309 07/27/20 0358 07/27/20 0826 07/27/20 1158  BP: 100/64 (!) 93/59 (!) 94/55 105/68  Pulse: 86 77 75 65  Resp: 18 20 20 16   Temp: 98.9 F (37.2 C) 98 F (36.7 C)  98.2 F (36.8 C) 98.1 F (36.7 C)  TempSrc: Oral Oral Oral Oral  SpO2: 96% 95% 98% 100%  Weight:      Height:        Intake/Output Summary (Last 24 hours) at 07/27/2020 1311 Last data filed at 07/27/2020 0121 Gross per 24 hour  Intake 0 ml  Output 2250 ml  Net -2250 ml   Filed Weights   07/25/20 0300 07/26/20 0500  Weight: 73.4 kg 69.7 kg    Examination:  General exam: Appears calm and mildly uncomfortable Respiratory system: Clear to auscultation. Respiratory effort normal. Cardiovascular system: S1 & S2 heard, RRR. No JVD, murmurs, rubs, gallops or clicks. No pedal edema. Gastrointestinal system: abd mldly distended improved from yesterday, ttp upper quadrants, stapled left sided incision c/d/i Central nervous system: Alert and oriented. No focal neurological deficits. Extremities: Symmetric 5 x 5 power. Skin: No rashes, lesions or ulcers Psychiatry: Judgement and insight appear normal. Mood & affect appropriate.     Data Reviewed: I have personally reviewed following labs and imaging studies  CBC: Recent Labs  Lab 07/24/20 1945 07/25/20 0456 07/26/20 0547 07/27/20 0609  WBC 11.2* 11.8* 10.2 9.5  NEUTROABS 9.3* 9.9*  --  7.3  HGB 16.9 14.5 12.5* 10.2*  HCT 47.2 39.5 34.3* 29.0*  MCV 89.9 89.4 89.8 94.2  PLT 406* 365 286 267   Basic Metabolic Panel: Recent Labs  Lab 07/24/20 1945 07/24/20 2205 07/25/20 0456 07/26/20 0547 07/27/20 0609  NA 127* 125* 125* 131* 134*  K 2.7* 3.6 3.5 2.9* 3.7  CL 75* 72* 76* 88* 97*  CO2 22 26 28 28 28   GLUCOSE 116* 79 87 99 108*  BUN 102* 105* 112* 106* 47*  CREATININE 5.25* 5.83* 5.61* 4.07* 1.39*  CALCIUM 7.0* 7.5* 7.5* 7.3* 7.8*  MG  --  2.6*  --  2.6* 2.8*  PHOS  --   --   --   --  2.0*   GFR: Estimated Creatinine Clearance: 61.3 mL/min (A) (by C-G formula based on SCr of 1.39 mg/dL (H)). Liver Function Tests: Recent Labs  Lab 07/24/20 1945 07/25/20 0456 07/27/20 0609  AST 34 25 24  ALT 15 13 14   ALKPHOS 54  46 36*  BILITOT 1.4* 1.7* 0.8  PROT 7.4 6.7 5.8*  ALBUMIN 3.5 3.7 2.7*   No results for input(s): LIPASE, AMYLASE in the last 168 hours. No results for input(s): AMMONIA in the last 168 hours. Coagulation Profile: Recent Labs  Lab 07/24/20 1945 07/25/20 0456  INR 1.3* 1.4*   Cardiac Enzymes: No results for input(s): CKTOTAL, CKMB, CKMBINDEX, TROPONINI in the last 168 hours. BNP (last 3 results) No results for input(s): PROBNP in the last 8760 hours. HbA1C: No results for input(s): HGBA1C in the last 72 hours. CBG: Recent Labs  Lab 07/24/20 2011 07/25/20 0257 07/26/20 2342 07/27/20 0637 07/27/20 1200  GLUCAP 115* 92 91 109* 100*   Lipid Profile: Recent Labs    07/27/20 0609  TRIG 132  Thyroid Function Tests: No results for input(s): TSH, T4TOTAL, FREET4, T3FREE, THYROIDAB in the last 72 hours. Anemia Panel: No results for input(s): VITAMINB12, FOLATE, FERRITIN, TIBC, IRON, RETICCTPCT in the last 72 hours. Urine analysis: No results found for: COLORURINE, APPEARANCEUR, LABSPEC, PHURINE, GLUCOSEU, HGBUR, BILIRUBINUR, KETONESUR, PROTEINUR, UROBILINOGEN, NITRITE, LEUKOCYTESUR Sepsis Labs: @LABRCNTIP (procalcitonin:4,lacticidven:4)  ) Recent Results (from the past 240 hour(s))  SARS Coronavirus 2 by RT PCR (hospital order, performed in [facility] hospital lab) Nasopharyngeal Nasopharyngeal Swab     Status: None   Collection Time: 07/24/20  7:46 PM   Specimen: Nasopharyngeal Swab  Result Value Ref Range Status   SARS Coronavirus 2 NEGATIVE NEGATIVE Final    Comment: (NOTE) SARS-CoV-2 target nucleic acids are NOT DETECTED.  The SARS-CoV-2 RNA is generally detectable in upper and lower respiratory specimens during the acute phase of infection. The lowest concentration of SARS-CoV-2 viral copies this assay can detect is 250 copies / mL. A negative result does not preclude SARS-CoV-2 infection and should not be used as the sole basis for treatment or other patient  management decisions.  A negative result may occur with improper specimen collection / handling, submission of specimen other than nasopharyngeal swab, presence of viral mutation(s) within the areas targeted by this assay, and inadequate number of viral copies (<250 copies / mL). A negative result must be combined with clinical observations, patient history, and epidemiological information.  Fact Sheet for Patients:   09/21/20  Fact Sheet for Healthcare Providers: BoilerBrush.com.cy  This test is not yet approved or  cleared by the https://pope.com/ FDA and has been authorized for detection and/or diagnosis of SARS-CoV-2 by FDA under an Emergency Use Authorization (EUA).  This EUA will remain in effect (meaning this test can be used) for the duration of the COVID-19 declaration under Section 564(b)(1) of the Act, 21 U.S.C. section 360bbb-3(b)(1), unless the authorization is terminated or revoked sooner.  Performed at [facility], [address] [place], [place]   Blood culture (routine x 2)     Status: None (Preliminary result)   Collection Time: 07/24/20 10:05 PM   Specimen: BLOOD  Result Value Ref Range Status   Specimen Description BLOOD LEFT ANTECUBITAL  Final   Special Requests   Final    BOTTLES DRAWN AEROBIC AND ANAEROBIC Blood Culture results may not be optimal due to an inadequate volume of blood received in culture bottles   Culture   Final    NO GROWTH 3 DAYS Performed at [facility], [address] [place], [place]    Report Status PENDING  Incomplete  Blood culture (routine x 2)     Status: None (Preliminary result)   Collection Time: 07/24/20 10:05 PM   Specimen: BLOOD  Result Value Ref Range Status   Specimen Description BLOOD RIGHT ANTECUBITAL  Final   Special Requests   Final    BOTTLES DRAWN AEROBIC AND ANAEROBIC Blood Culture results may not be optimal due to an  inadequate volume of blood received in culture bottles   Culture   Final    NO GROWTH 3 DAYS Performed at [facility], [address] [place], [place]    Report Status PENDING  Incomplete  MRSA PCR Screening     Status: None   Collection Time: 07/25/20  3:00 AM   Specimen: Nasopharyngeal  Result Value Ref Range Status   MRSA by PCR NEGATIVE NEGATIVE Final    Comment:  The GeneXpert MRSA Assay (FDA approved for NASAL specimens only), is one component of a comprehensive MRSA colonization surveillance program. It is not intended to diagnose MRSA infection nor to guide or monitor treatment for MRSA infections. Performed at [facility], [address] [place] [address], [place], [place] [address]          Radiology Studies: EEG  Result Date: 07/25/2020 Charlsie Quest, MD     07/25/2020  3:51 PM Patient Name: Alan Nelson MRN: [id number] Epilepsy Attending: Charlsie Quest Referring Physician/Provider: Dr Ritta Slot Date: 07/24/2020 Duration: 21.07 mins Patient history: 55 year old male with a history of seizures in the setting of Wellbutrin use who presents with reported convulsion in the setting of severe hypotension. EEG to evaluate for seizure Level of alertness: Awake,  asleep AEDs during EEG study: None Technical aspects: This EEG study was done with scalp electrodes positioned according to the 10-20 International system of electrode placement. Electrical activity was acquired at a sampling rate of 500Hz  and reviewed with a high frequency filter of 70Hz  and a low frequency filter of 1Hz . EEG data were recorded continuously and digitally stored. Description: The posterior dominant rhythm consists of 8 Hz activity of moderate voltage (25-35 uV) seen predominantly in posterior head regions, symmetric and reactive to eye opening and eye closing. Sleep was characterized by vertex waves, sleep spindles (12 to 14 Hz), maximal frontocentral region.  Hyperventilation and photic stimulation were not performed.   IMPRESSION: This study is within normal limits. No seizures or epileptiform discharges were seen throughout the recording. Charlsie Quest   DG ABD ACUTE 2+V W 1V CHEST  Result Date: 07/27/2020 CLINICAL DATA:  Ileus EXAM: DG ABDOMEN ACUTE WITH 1 VIEW CHEST COMPARISON:  07/26/2020 FINDINGS: Minimal left basilar atelectasis. Lungs are otherwise clear. No pneumothorax or pleural effusion. Right upper extremity PICC line tip noted within the superior vena cava. Cardiac size within normal limits. Pulmonary vascularity is normal. Multiple healed left rib fractures noted. Nasogastric tube extends into the left upper quadrant of the abdomen. Free intraperitoneal gas is again identified, though appears decreased when compared to prior examination. Multiple gas-filled nondilated loops of large and small bowel are identified with gas extending into the rectal vault suggesting changes of a mild underlying ileus. Surgical skin staples overlie the left flank. No organomegaly. T12 kyphoplasty has been performed. IMPRESSION: Nasogastric tube in appropriate position. Decreasing pneumoperitoneum. Mild ileus. Electronically Signed   By: Helyn Numbers MD   On: 07/27/2020 08:48   DG ABD ACUTE 2+V W 1V CHEST  Result Date: 07/26/2020 CLINICAL DATA:  Recent surgery EXAM: DG ABDOMEN ACUTE WITH 1 VIEW CHEST COMPARISON:  July 25, 2019 FINDINGS: Cardiomediastinal silhouette is normal in contour. Scattered bibasilar atelectasis. No pleural effusion or pneumothorax. Enteric tube tip and side port project over the stomach. There is pneumoperitoneum, consistent with recent laparotomy. Soft tissue air. Surgical staples. Surgical sutures project over the mid abdomen. Air filled nondilated loops of bowel. Remote bilateral rib fractures. Status post vertebral augmentation of T12. IMPRESSION: 1. Enteric tube tip and side port project over the stomach. 2. Pneumoperitoneum,  consistent with recent laparotomy. No dilated loops of bowel are seen. Electronically Signed   By: Meda Klinefelter MD   On: 07/26/2020 08:24   [place] EKG SITE RITE  Result Date: 07/26/2020 If Site Rite image not attached, placement could not be confirmed due to current cardiac rhythm.       Scheduled Meds: . Chlorhexidine Gluconate Cloth  6 each  Topical Q0600  . heparin  5,000 Units Subcutaneous Q8H  . insulin aspart  0-9 Units Subcutaneous TID WC  . sodium chloride flush  10-40 mL Intracatheter Q12H  . tamsulosin  0.8 mg Oral Daily   Continuous Infusions: . sodium chloride    . sodium chloride    . piperacillin-tazobactam (ZOSYN)  IV    . potassium PHOSPHATE IVPB (in mmol)    . sodium chloride Stopped (07/25/20 2019)  . TPN ADULT (ION) 30 mL/hr at 07/26/20 1909  . TPN ADULT (ION)       LOS: 3 days    Time spent: 40 min    Silvano BilisNoah B Romone Shaff, MD [facility]   If 7PM-7AM, please contact night-coverage [email or web address] Password [email or web address] 07/27/2020, 1:11 PM

## 2020-07-27 NOTE — TOC Initial Note (Signed)
Transition of Care [facility]) - Initial/Assessment Note    Patient Details  Name: Alan Nelson MRN: [id number] Date of Birth: 03-18-1966  [facility] CM/SW Contact:    Chapman Fitch, RN Phone Number: 07/27/2020, 2:53 PM  Clinical Narrative:                  Patient admitted from [facility]  Plan for patient to return at discharge Patient with high risk for readmission score, Patient currently in HD unable to assess at this time  PT eval pending        Patient Goals and CMS Choice        Expected Discharge Plan and Services                                                Prior Living Arrangements/Services                       Activities of Daily Living      Permission Sought/Granted                  Emotional Assessment              Admission diagnosis:  Bowel obstruction (HCC) [K56.609] AKI (acute kidney injury) (HCC) [N17.9] Hypotension, unspecified hypotension type [I95.9] Acute renal failure, unspecified acute renal failure type (HCC) [N17.9] Non-recurrent abdominal hernia with obstruction without gangrene, unspecified hernia type [K46.0] Patient Active Problem List   Diagnosis Date Noted  . Protein-calorie malnutrition, severe 07/26/2020  . Bowel obstruction (HCC) 07/24/2020   PCP:  Burnis Medin, PA-C Pharmacy:   [facility], [place] - [address] [place] [address] Phone: [phone] Fax: [phone]     Social Determinants of Health (SDOH) Interventions    Readmission Risk Interventions No flowsheet data found.

## 2020-07-27 NOTE — Progress Notes (Signed)
PHARMACY NOTE:  ANTIMICROBIAL RENAL DOSAGE ADJUSTMENT  Current antimicrobial regimen includes a mismatch between antimicrobial dosage and estimated renal function.  As per policy approved by the Pharmacy & Therapeutics and Medical Executive Committees, the antimicrobial dosage will be adjusted accordingly.  Current antimicrobial dosage:  Zosyn 2.25 grams IV every 8 hours  Indication: intra-abdominal infection  Renal Function:  Estimated Creatinine Clearance: 61.3 mL/min (A) (by C-G formula based on SCr of 1.39 mg/dL (H)).    Antimicrobial dosage has been changed to:  Zosyn 3.375 grams IV every 8 hours   Thank you for allowing pharmacy to be a part of this patient's care.  Burnis Medin, PharmD 07/27/2020 10:50 AM

## 2020-07-27 NOTE — Progress Notes (Signed)
[facility], [place] 07/27/20  Subjective:   Hospital day # 3  Doing fair NGT in place, clamped Denies any acute complaints Renal: 02/03 0701 - 02/04 0700 In: 230 [P.O.:30; I.V.:200] Out: 4701 [Urine:3550; Emesis/NG output:1150; Stool:1] Lab Results  Component Value Date   CREATININE 1.39 (H) 07/27/2020   CREATININE 4.07 (H) 07/26/2020   CREATININE 5.61 (H) 07/25/2020     Objective:  Vital signs in last 24 hours:  Temp:  [98 F (36.7 C)-98.9 F (37.2 C)] 98.1 F (36.7 C) (02/04 1158) Pulse Rate:  [65-99] 65 (02/04 1158) Resp:  [16-20] 16 (02/04 1158) BP: (87-117)/(55-80) 105/68 (02/04 1158) SpO2:  [93 %-100 %] 100 % (02/04 1158)  Weight change:  Filed Weights   07/25/20 0300 07/26/20 0500  Weight: 73.4 kg 69.7 kg    Intake/Output:    Intake/Output Summary (Last 24 hours) at 07/27/2020 1324 Last data filed at 07/27/2020 0121 Gross per 24 hour  Intake 0 ml  Output 2250 ml  Net -2250 ml   Physical Exam: General:  No acute distress, laying in the bed  HEENT  anicteric, dry oral mucous membrane, NGT in place  Pulm/lungs  normal breathing effort, lungs are clear to auscultation  CVS/Heart  regular rhythm, no rub or gallop  Abdomen:   Soft, surgical dressing in place, clean  Extremities:  No peripheral edema  Neurologic:  Alert, oriented, able to follow commands  Skin:  No acute rashes  Foley in place with clear yellow urine   Basic Metabolic Panel:  Recent Labs  Lab 07/24/20 1945 07/24/20 2205 07/25/20 0456 07/26/20 0547 07/27/20 0609  NA 127* 125* 125* 131* 134*  K 2.7* 3.6 3.5 2.9* 3.7  CL 75* 72* 76* 88* 97*  CO2 22 26 28 28 28   GLUCOSE 116* 79 87 99 108*  BUN 102* 105* 112* 106* 47*  CREATININE 5.25* 5.83* 5.61* 4.07* 1.39*  CALCIUM 7.0* 7.5* 7.5* 7.3* 7.8*  MG  --  2.6*  --  2.6* 2.8*  PHOS  --   --   --   --  2.0*     CBC: Recent Labs  Lab 07/24/20 1945 07/25/20 0456 07/26/20 0547 07/27/20 0609  WBC  11.2* 11.8* 10.2 9.5  NEUTROABS 9.3* 9.9*  --  7.3  HGB 16.9 14.5 12.5* 10.2*  HCT 47.2 39.5 34.3* 29.0*  MCV 89.9 89.4 89.8 94.2  PLT 406* 365 286 267     No results found for: HEPBSAG, HEPBSAB, HEPBIGM    Microbiology:  Recent Results (from the past 240 hour(s))  SARS Coronavirus 2 by RT PCR (hospital order, performed in [facility] hospital lab) Nasopharyngeal Nasopharyngeal Swab     Status: None   Collection Time: 07/24/20  7:46 PM   Specimen: Nasopharyngeal Swab  Result Value Ref Range Status   SARS Coronavirus 2 NEGATIVE NEGATIVE Final    Comment: (NOTE) SARS-CoV-2 target nucleic acids are NOT DETECTED.  The SARS-CoV-2 RNA is generally detectable in upper and lower respiratory specimens during the acute phase of infection. The lowest concentration of SARS-CoV-2 viral copies this assay can detect is 250 copies / mL. A negative result does not preclude SARS-CoV-2 infection and should not be used as the sole basis for treatment or other patient management decisions.  A negative result may occur with improper specimen collection / handling, submission of specimen other than nasopharyngeal swab, presence of viral mutation(s) within the areas targeted by this assay, and inadequate number of viral copies (<250 copies /  mL). A negative result must be combined with clinical observations, patient history, and epidemiological information.  Fact Sheet for Patients:   BoilerBrush.com.cy  Fact Sheet for Healthcare Providers: https://pope.com/  This test is not yet approved or  cleared by the [place] FDA and has been authorized for detection and/or diagnosis of SARS-CoV-2 by FDA under an Emergency Use Authorization (EUA).  This EUA will remain in effect (meaning this test can be used) for the duration of the COVID-19 declaration under Section 564(b)(1) of the Act, 21 U.S.C. section 360bbb-3(b)(1), unless the authorization is  terminated or revoked sooner.  Performed at [facility], [address] [place], [place] [address]   Blood culture (routine x 2)     Status: None (Preliminary result)   Collection Time: 07/24/20 10:05 PM   Specimen: BLOOD  Result Value Ref Range Status   Specimen Description BLOOD LEFT ANTECUBITAL  Final   Special Requests   Final    BOTTLES DRAWN AEROBIC AND ANAEROBIC Blood Culture results may not be optimal due to an inadequate volume of blood received in culture bottles   Culture   Final    NO GROWTH 3 DAYS Performed at [facility], [address] [place], [place] [address]    Report Status PENDING  Incomplete  Blood culture (routine x 2)     Status: None (Preliminary result)   Collection Time: 07/24/20 10:05 PM   Specimen: BLOOD  Result Value Ref Range Status   Specimen Description BLOOD RIGHT ANTECUBITAL  Final   Special Requests   Final    BOTTLES DRAWN AEROBIC AND ANAEROBIC Blood Culture results may not be optimal due to an inadequate volume of blood received in culture bottles   Culture   Final    NO GROWTH 3 DAYS Performed at [facility], [address], [place], [place] [address]    Report Status PENDING  Incomplete  MRSA PCR Screening     Status: None   Collection Time: 07/25/20  3:00 AM   Specimen: Nasopharyngeal  Result Value Ref Range Status   MRSA by PCR NEGATIVE NEGATIVE Final    Comment:        The GeneXpert MRSA Assay (FDA approved for NASAL specimens only), is one component of a comprehensive MRSA colonization surveillance program. It is not intended to diagnose MRSA infection nor to guide or monitor treatment for MRSA infections. Performed at [facility], [address] [place], [place] [address]     Coagulation Studies: Recent Labs    07/24/20 1945 07/25/20 0456  LABPROT 15.5* 16.5*  INR 1.3* 1.4*    Urinalysis: No results for input(s): COLORURINE, LABSPEC, PHURINE, GLUCOSEU, HGBUR,  BILIRUBINUR, KETONESUR, PROTEINUR, UROBILINOGEN, NITRITE, LEUKOCYTESUR in the last 72 hours.  Invalid input(s): APPERANCEUR    Imaging: EEG  Result Date: 07/25/2020 Charlsie Quest, MD     07/25/2020  3:51 PM Patient Name: RAYWOOD WAILES MRN: [id number] Epilepsy Attending: Charlsie Quest Referring Physician/Provider: Dr Ritta Slot Date: 07/24/2020 Duration: 21.07 mins Patient history: 55 year old male with a history of seizures in the setting of Wellbutrin use who presents with reported convulsion in the setting of severe hypotension. EEG to evaluate for seizure Level of alertness: Awake,  asleep AEDs during EEG study: None Technical aspects: This EEG study was done with scalp electrodes positioned according to the 10-20 International system of electrode placement. Electrical activity was acquired at a sampling rate of 500Hz  and reviewed with a high frequency filter of 70Hz   and a low frequency filter of 1Hz . EEG data were recorded continuously and digitally stored. Description: The posterior dominant rhythm consists of 8 Hz activity of moderate voltage (25-35 uV) seen predominantly in posterior head regions, symmetric and reactive to eye opening and eye closing. Sleep was characterized by vertex waves, sleep spindles (12 to 14 Hz), maximal frontocentral region. Hyperventilation and photic stimulation were not performed.   IMPRESSION: This study is within normal limits. No seizures or epileptiform discharges were seen throughout the recording.   DG ABD ACUTE 2+V W 1V CHEST  Result Date: 07/27/2020 CLINICAL DATA:  Ileus EXAM: DG ABDOMEN ACUTE WITH 1 VIEW CHEST COMPARISON:  07/26/2020 FINDINGS: Minimal left basilar atelectasis. Lungs are otherwise clear. No pneumothorax or pleural effusion. Right upper extremity PICC line tip noted within the superior vena cava. Cardiac size within normal limits. Pulmonary vascularity is normal. Multiple healed left rib fractures noted. Nasogastric  tube extends into the left upper quadrant of the abdomen. Free intraperitoneal gas is again identified, though appears decreased when compared to prior examination. Multiple gas-filled nondilated loops of large and small bowel are identified with gas extending into the rectal vault suggesting changes of a mild underlying ileus. Surgical skin staples overlie the left flank. No organomegaly. T12 kyphoplasty has been performed. IMPRESSION: Nasogastric tube in appropriate position. Decreasing pneumoperitoneum. Mild ileus. Electronically Signed   By: 09/23/2020 MD   On: 07/27/2020 08:48   DG ABD ACUTE 2+V W 1V CHEST  Result Date: 07/26/2020 CLINICAL DATA:  Recent surgery EXAM: DG ABDOMEN ACUTE WITH 1 VIEW CHEST COMPARISON:  July 25, 2019 FINDINGS: Cardiomediastinal silhouette is normal in contour. Scattered bibasilar atelectasis. No pleural effusion or pneumothorax. Enteric tube tip and side port project over the stomach. There is pneumoperitoneum, consistent with recent laparotomy. Soft tissue air. Surgical staples. Surgical sutures project over the mid abdomen. Air filled nondilated loops of bowel. Remote bilateral rib fractures. Status post vertebral augmentation of T12. IMPRESSION: 1. Enteric tube tip and side port project over the stomach. 2. Pneumoperitoneum, consistent with recent laparotomy. No dilated loops of bowel are seen. Electronically Signed   By: July 27, 2019 MD   On: 07/26/2020 08:24   09/23/2020 EKG SITE RITE  Result Date: 07/26/2020 If Site Rite image not attached, placement could not be confirmed due to current cardiac rhythm.    Medications:   . sodium chloride    . piperacillin-tazobactam (ZOSYN)  IV    . potassium PHOSPHATE IVPB (in mmol)    . sodium chloride Stopped (07/25/20 2019)  . TPN ADULT (ION) 30 mL/hr at 07/26/20 1909  . TPN ADULT (ION)     . buprenorphine-naloxone  1 tablet Sublingual BID  . Chlorhexidine Gluconate Cloth  6 each Topical Q0600  . heparin  5,000  Units Subcutaneous Q8H  . sodium chloride flush  10-40 mL Intracatheter Q12H  . tamsulosin  0.8 mg Oral Daily   HYDROmorphone (DILAUDID) injection, LORazepam, ondansetron **OR** ondansetron (ZOFRAN) IV, ondansetron, sodium chloride flush  Assessment/ Plan:  55 y.o. male with known history of seizure disorder, hypertension, migraine, asthma, BPH, anxiety, depression, polysubstance abuse, lumbar disc disease    admitted on 07/24/2020 for Bowel obstruction (HCC) [K56.609] AKI (acute kidney injury) (HCC) [N17.9] Hypotension, unspecified hypotension type [I95.9] Acute renal failure, unspecified acute renal failure type (HCC) [N17.9] Non-recurrent abdominal hernia with obstruction without gangrene, unspecified hernia type [K46.0]  # AKI Baseline Creatinine of 0.76 on Jun 14, 2020 Admitted with high Creatinine Will  obtain u/a Imaging: CT abdomen without contrast 07/24/2020- tiny non obstructing stones, bladder decompressed Patient was hypotensive on admission at 77/64 AKI likely due to ATN from hypotension and volume depletion  02/03 0701 - 02/04 0700 In: 230 [P.O.:30; I.V.:200] Out: 4701 [Urine:3550; Emesis/NG output:1150; Stool:1]  Lab Results  Component Value Date   CREATININE 1.39 (H) 07/27/2020   CREATININE 4.07 (H) 07/26/2020   CREATININE 5.61 (H) 07/25/2020   Good urine output.  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  Please reconsult as necessary    LOS: 3 Desman Polak Thedore Mins 2/4/20221:24 [facility] [place], [place] [phone]  Note: This note  was prepared with Dragon dictation. Any transcription errors are unintentional

## 2020-07-27 NOTE — Consult Note (Signed)
PHARMACY - TOTAL PARENTERAL NUTRITION CONSULT NOTE   Indication: Small bowel obstruction s/p laporotomy  Patient Measurements: Height: 6' (182.9 cm) Weight: 69.7 kg (153 lb 10.6 oz) IBW/kg (Calculated) : 77.6   Body mass index is 20.84 kg/m. Usual Weight: 69.7 kg  Assessment:  55yo WM with history of seizure d/o, HTN, migraines, asthma, BPH, anxiety/MDD, polysubstance abuse, and lumbar disc disease presenting with acute seizure followed by post-ictal confusion, hypotension, and lower abd'l pain with N/V. Abd/pelvis CT revealed hernia, incarcerated bowel and is now s/p correction with emergent laparotomy. Pt now on medical floor, NPO and pharmacy consulted for management of TPN.  Glucose / Insulin: 24-hr trend 91 - 109 mg/dL (no insulin required) Electrolytes: hyponatremia, hypophosphatemia Renal:  5.61-->1.39 (BL 0.76 - 06/14/2020) LFTs / TGs: AST/ALT - wnl  /  TG 132 Prealbumin / albumin: ___  /  3.7 Intake / Output:  02/03 0701 - 02/04 0700 In: 230 [P.O.:30; I.V.:200] Out: 4701 [Urine:3550; Emesis/NG output:1150; Stool:1]  MIVF: NS+59meq KCL @100ml /hr  GI Imaging: 2/01 - CT Abd/Pelvis: Spigelian hernia in the left abdominal wall lateral to the rectus muscle with an incarcerated loop of small bowel causing more proximal small bowel and gastric distension. The abdominal wall defect was seen on the prior exam although no herniation was noted at that time. Surgeries / Procedures:  s/p exploratory laparotomy and small bowel resectionfor strangulated incisional hernia 02/01  Central access: 07/26/20 TPN start date: 07/26/20  Nutritional Goals (per RD recommendation on 2/03): kCal: 2-2.3, Protein: 100-115, Fluid: 2-2.3 L  Current Nutrition:  NPO and TPN ; ice chips  Plan:   advance TPN to 73mL/hr at 1800 (total volume 1540 mL)  Nutritional Components  Amino acids (Travasol 10%): 50 g/L (72 grams total)  Dextrose: 25 % (360 grams total)  1512 kCal/24  hours  Electrolytes in TPN (standard): 47mEq/L of Na, 45mEq/L of K, 3mEq/L of Ca, 20mEq/L of Mg, and 76mmol/L of Phos. Cl:Ac ratio 1:1  Add standard MVI and trace elements to TPN  Hold lipids for 7-10 days due to national shortage  100 mg IV thiamine added to TPN x 3 days (day 1)  continue Sensitive q6h SSI and adjust as needed   Remove potassium from IV fluid and reduce MIVF to 90mL/hr at 1800  15 mmol IV potassium phosphate x 1 (this provides 29 mEq IV potassium)  Monitor TPN labs on Mon/Thurs, daily until stable   79m 07/27/2020,7:23 AM

## 2020-07-28 DIAGNOSIS — K56601 Complete intestinal obstruction, unspecified as to cause: Secondary | ICD-10-CM | POA: Diagnosis not present

## 2020-07-28 LAB — COMPREHENSIVE METABOLIC PANEL
ALT: 15 U/L (ref 0–44)
AST: 25 U/L (ref 15–41)
Albumin: 2.7 g/dL — ABNORMAL LOW (ref 3.5–5.0)
Alkaline Phosphatase: 38 U/L (ref 38–126)
Anion gap: 7 (ref 5–15)
BUN: 18 mg/dL (ref 6–20)
CO2: 24 mmol/L (ref 22–32)
Calcium: 7.9 mg/dL — ABNORMAL LOW (ref 8.9–10.3)
Chloride: 102 mmol/L (ref 98–111)
Creatinine, Ser: 0.87 mg/dL (ref 0.61–1.24)
GFR, Estimated: >60 mL/min (ref >60)
Glucose, Bld: 112 mg/dL — ABNORMAL HIGH (ref 70–99)
Potassium: 4.4 mmol/L (ref 3.5–5.1)
Sodium: 133 mmol/L — ABNORMAL LOW (ref 135–145)
Total Bilirubin: 0.5 mg/dL (ref 0.3–1.2)
Total Protein: 5.6 g/dL — ABNORMAL LOW (ref 6.5–8.1)

## 2020-07-28 LAB — PHOSPHORUS: Phosphorus: 1 mg/dL — CL (ref 2.5–4.6)

## 2020-07-28 LAB — CBC
HCT: 28.6 % — ABNORMAL LOW (ref 39.0–52.0)
Hemoglobin: 9.8 g/dL — ABNORMAL LOW (ref 13.0–17.0)
MCH: 32.9 pg (ref 26.0–34.0)
MCHC: 34.3 g/dL (ref 30.0–36.0)
MCV: 96 fL (ref 80.0–100.0)
Platelets: 280 10*3/uL (ref 150–400)
RBC: 2.98 MIL/uL — ABNORMAL LOW (ref 4.22–5.81)
RDW: 12.3 % (ref 11.5–15.5)
WBC: 11.5 10*3/uL — ABNORMAL HIGH (ref 4.0–10.5)
nRBC: 0 % (ref 0.0–0.2)

## 2020-07-28 LAB — GLUCOSE, CAPILLARY
Glucose-Capillary: 100 mg/dL — ABNORMAL HIGH (ref 70–99)
Glucose-Capillary: 101 mg/dL — ABNORMAL HIGH (ref 70–99)
Glucose-Capillary: 76 mg/dL (ref 70–99)
Glucose-Capillary: 95 mg/dL (ref 70–99)

## 2020-07-28 LAB — MAGNESIUM: Magnesium: 2 mg/dL (ref 1.7–2.4)

## 2020-07-28 MED ORDER — SODIUM PHOSPHATES 45 MMOLE/15ML IV SOLN
30.0000 mmol | Freq: Once | INTRAVENOUS | Status: AC
  Administered 2020-07-28: 30 mmol via INTRAVENOUS
  Filled 2020-07-28: qty 10

## 2020-07-28 MED ORDER — TRAVASOL 10 % IV SOLN
INTRAVENOUS | Status: DC
  Filled 2020-07-28: qty 720

## 2020-07-28 MED ORDER — QUETIAPINE FUMARATE 100 MG PO TABS
200.0000 mg | ORAL_TABLET | Freq: Every day | ORAL | Status: DC
  Administered 2020-07-28 – 2020-07-29 (×2): 200 mg via ORAL
  Filled 2020-07-28 (×2): qty 2

## 2020-07-28 MED ORDER — PANTOPRAZOLE SODIUM 40 MG PO TBEC
40.0000 mg | DELAYED_RELEASE_TABLET | Freq: Every day | ORAL | Status: DC
  Administered 2020-07-28 – 2020-07-30 (×2): 40 mg via ORAL
  Filled 2020-07-28 (×3): qty 1

## 2020-07-28 MED ORDER — CHLORHEXIDINE GLUCONATE CLOTH 2 % EX PADS
6.0000 | MEDICATED_PAD | Freq: Every day | CUTANEOUS | Status: DC
  Administered 2020-07-28 – 2020-07-30 (×3): 6 via TOPICAL

## 2020-07-28 NOTE — Progress Notes (Signed)
Physical Therapy Treatment Patient Details Name: Alan Nelson MRN: [id number] DOB: April 14, 1966 Today's Date: 07/28/2020    History of Present Illness PMHx significant for seizure disorder, HTN, migraine, polysubstance abuse, and lumbar disc disease. Pt presented to ER with acute onset of seizure followed by postictal phase with confusion. PT was hTN in the 50s systolic. He is now s/p emergent ex lap with partial small bowel resection. NG tube in place, guard present at bedside.    PT Comments    A better assessment of the patient's current level of function was able to be completed on this date. He continues to present with bilateral hip strength deficits, which may be causing balance and gait deficits. The pt scores a 18/28 on the Tinetti Balance and Gait Assessment this session indicating high risk of fall. During ambulation the pt requires intermittent Min A for steadying during gait. He completes 30s STS with only 6 completed transfers which is indicative of diminished leg strength and endurance. (Normal for his age range is >30). Continue for PT to see while in house to address balance and gait deficits.     Follow Up Recommendations  Other (comment);Supervision - Intermittent (It is expected that by discharge the pt will be able to return to detention center without further needs.)     Equipment Recommendations       Recommendations for Other Services       Precautions / Restrictions Precautions Precautions: Fall Restrictions Weight Bearing Restrictions: No    Mobility  Bed Mobility Overal bed mobility: Independent                Transfers Overall transfer level: Needs assistance Equipment used: None Transfers: Sit to/from Stand Sit to Stand: Supervision         General transfer comment: Pt with initial unsteadiness upon standing.  Ambulation/Gait Ambulation/Gait assistance: Min guard Gait Distance (Feet): 400 Feet Assistive device: None       General  Gait Details: hesitancy in cadence, intermittent narrow BOS with scissoring noted. Pt given verbal cues to increased gait speed with mild improvement in gait pattern.   Stairs             Wheelchair Mobility    Modified Rankin (Stroke Patients Only)       Balance Overall balance assessment: Needs assistance Sitting-balance support: No upper extremity supported Sitting balance-Leahy Scale: Good     Standing balance support: No upper extremity supported;During functional activity Standing balance-Leahy Scale: Fair                   Standardized Balance Assessment Standardized Balance Assessment :  (Tinetti Balance and Gait Assessment: 18/28)          Cognition Arousal/Alertness: Awake/alert Behavior During Therapy: WFL for tasks assessed/performed Overall Cognitive Status: Within Functional Limits for tasks assessed                                 General Comments: A&Ox4      Exercises Other Exercises Other Exercises: James Ivanoff: x12 with 3 sec hold at top, verbal cues for posterior pelvic tilt to neutralize pelvis. Other Exercises: Educated on single leg glute bridge, however pt is not appropriate for completion of this exercise for HEP at this time. Other Exercises: Clam Shells bilaterally: x10; pt positioning in slide lying with verbal and tactile cues to prevent rolling posteriorly.    General Comments  Pertinent Vitals/Pain Pain Assessment: No/denies pain    Home Living Family/patient expects to be discharged to:: Dentention/Prison                    Prior Function            PT Goals (current goals can now be found in the care plan section) Acute Rehab PT Goals Patient Stated Goal: to return to PLOF PT Goal Formulation: With patient Time For Goal Achievement: 08/10/20 Potential to Achieve Goals: Good Progress towards PT goals: Progressing toward goals    Frequency    Min 2X/week      PT Plan  Current plan remains appropriate    Co-evaluation              AM-PAC PT "6 Clicks" Mobility   Outcome Measure  Help needed turning from your back to your side while in a flat bed without using bedrails?: None Help needed moving from lying on your back to sitting on the side of a flat bed without using bedrails?: None Help needed moving to and from a bed to a chair (including a wheelchair)?: A Little Help needed standing up from a chair using your arms (e.g., wheelchair or bedside chair)?: A Little Help needed to walk in hospital room?: A Little Help needed climbing 3-5 steps with a railing? : A Little 6 Click Score: 20    End of Session   Activity Tolerance: Patient tolerated treatment well Patient left: in bed;with family/visitor present (guard present)   PT Visit Diagnosis: Muscle weakness (generalized) (M62.81)     Time: 0737-1062 PT Time Calculation (min) (ACUTE ONLY): 23 min  Charges:  $Gait Training: 8-22 mins $Therapeutic Exercise: 8-22 mins                     10:12 AM, 07/28/20 Shakemia Madera A. Mordecai Maes PT, DPT Physical Therapist - [facility]    Xoie Kreuser A Reeve Mallo 07/28/2020, 10:02 AM

## 2020-07-28 NOTE — Progress Notes (Signed)
[facility] SURGICAL PROGRESS NOTE  Hospital Day(s): 4.   Post op day(s): 4 Days Post-Op.   Interval History:  Patient seen and examined No acute events or new complaints overnight.  He continues to have incisional soreness but mild improvement in this He denies nausea and is tolerating CLD No fever, chills, emesis UO - 1.4L Started on TPN (02/03) He has reported passing flatus   Vital signs in last 24 hours: [min-max] current  Temp:  [98.1 F (36.7 C)-98.9 F (37.2 C)] 98.3 F (36.8 C) (02/05 0504) Pulse Rate:  [65-93] 91 (02/05 0504) Resp:  [16-18] 16 (02/05 0504) BP: (99-141)/(67-87) 136/67 (02/05 0504) SpO2:  [97 %-100 %] 99 % (02/05 0504)     Height: 6' (182.9 cm) Weight: 69.7 kg BMI (Calculated): 20.84   Intake/Output last 2 shifts:  02/04 0701 - 02/05 0700 In: 3327.5 [P.O.:1140; I.V.:1897.5; IV Piggyback:290] Out: 1400 [Urine:1400]   Physical Exam:  Constitutional: alert, cooperative and no distress  HEENT: NGT in place; output appears dilute Respiratory: breathing non-labored at rest  Cardiovascular: regular rate and sinus rhythm  Gastrointestinal: Soft, incisional soreness improving, non-distended, no rebound/guarding. He does have a reducible and soft umbilical area hernia present.  Integumentary: Left mid abdomen incision is CDI with staples, no erythema, minimal drainage on dressing  Labs:  CBC Latest Ref Rng & Units 07/28/2020 07/27/2020 07/26/2020  WBC 4.0 - 10.5 K/uL 11.5(H) 9.5 10.2  Hemoglobin 13.0 - 17.0 g/dL 9.3(A) 10.2(L) 12.5(L)  Hematocrit 39.0 - 52.0 % 28.6(L) 29.0(L) 34.3(L)  Platelets 150 - 400 K/uL 280 267 286   CMP Latest Ref Rng & Units 07/28/2020 07/27/2020 07/26/2020  Glucose 70 - 99 mg/dL 355(D) 322(G) 99  BUN 6 - 20 mg/dL 18 25(K) 270(W)  Creatinine 0.61 - 1.24 mg/dL 2.37 6.28(B) 1.51(V)  Sodium 135 - 145 mmol/L 133(L) 134(L) 131(L)  Potassium 3.5 - 5.1 mmol/L 4.4 3.7 2.9(L)  Chloride 98 - 111 mmol/L 102 97(L) 88(L)  CO2 22 -  32 mmol/L 24 28 28   Calcium 8.9 - 10.3 mg/dL 7.9(L) 7.8(L) 7.3(L)  Total Protein 6.5 - 8.1 g/dL ) 6.1(Y) -  Total Bilirubin 0.3 - 1.2 mg/dL 0.5 0.8 -  Alkaline Phos 38 - 126 U/L 38 36(L) -  AST 15 - 41 U/L 25 24 -  ALT 0 - 44 U/L 15 14 -    Imaging studies:   KUB + CXR (07/27/2020) personally reviewed with air seen throughout the colon, mild gastric dilation, improved small bowel dilation in left mid abdomen, and radiologist report reviewed:  IMPRESSION: Nasogastric tube in appropriate position.  Decreasing pneumoperitoneum.  Mild ileus.   Assessment/Plan:  55 y.o. male with clinically improving post-operative ileus 4 Days Post-Op s/p exploratory laparotomy and small bowel resectionfor strangulated incisional hernia, complicated by, now improving, AKI secondary to likely septic shock   - May advance judiciously to full liquids for now;   - reassess foley need; good UO; nephrology following secondary to AKI - Continue IV ABx (Zosyn) - Monitor abdominal examination; on-going bowel function  - Serial KUBs prn - Pain control prn; antiemetics prn - Mobilization as tolerated; low threshold to engage PT - Further management per primary service; we will follow   All of the above findings and recommendations were discussed with the patient, and the medical team, and all of patient's questions were answered to his expressed satisfaction.  -- 46, M.D., [facility]  07/28/2020 ; 8:38 AM

## 2020-07-28 NOTE — Progress Notes (Addendum)
PROGRESS NOTE    Alan Nelson  AFB:[id number] DOB: 03-07-1966 DOA: 07/24/2020 PCP: Burnis Medin, PA-C  Outpatient Specialists: none    Brief Narrative:   Alan Nelson  is a 55 y.o. Caucasian male with a known history of seizure disorder, hypertension and migraine as well as asthma, BPH, anxiety/depression, polysubstance abuse and lumbar disc disease, who presented to the emergency room with acute onset of seizure followed by postictal phase with confusion.  He was found hypotensive with a blood pressure in the 50s systolic, complaining of abdominal pain mainly in the lower abdomen with associated nausea and vomiting.  He admitted to tactile fever and chills.  He has been having mild headache.  His abdominal pain is started on Thursday and got significantly worse today.  No chest pain or palpitations.  No bleeding diathesis.  Upon presentation to the ER, blood pressure was 77/64 with otherwise normal vital signs.  Later respiratory rate was 22 then 29 and with hydration blood pressure was up to 92/77 and later 126/99.  Labs revealed CMP with hyper kalemia, hyponatremia hypochloremia BUN of 102 and creatinine of 5.25 with a calcium of 7 indirect bili of 1.2 and total bili 1.4, high-sensitivity troponin I 145 lactic acid 4.9 with CBC showing leukocytosis of 11.2 with mild neutrophilia and thrombocytosis with INR 1.3 and PT 15.5.  COVID-19 PCR came back negative.  Portable chest ray showed minor bibasilar atelectasis.  Head CT scan revealed no acute abnormalities.  C-spine CT showed no fracture or subluxation.    Assessment & Plan:   Active Problems:   Bowel obstruction (HCC)   Protein-calorie malnutrition, severe  # Incarcerated small bowel hernia # Hypotension # Post-op ileus # Melena S/p emergent ex lap with partial small bowel resection 2/2. NG tube removed 2/4, now tolerating clear liquid diet. Green stool this morning, hgb stable.   - will touch base w/ GI about advancing  diet, stopping TPN- cont zosyn (2/1> ) - hydromorphone, zofran prn - note has picc - trend h/h  # Acute kidney injury # Hyponatremia # Hypokalemia # Hypophosphatemia Cr 5s on arrival, prior normal. Likely some degree of ATN 2/2 profound hypotension that is now resolved. Cr today improved to wnl. Renal u/s unremarkable. Pharmacy repleting low phos - nephrology following, will likely sign off - monitor lytes, is at risk for refeeding syndrome  # Seizure Hx seizure, presented post-ictal, likely precipitated by above acute illness. Post-ictal state has resolved. EEG wnl. Neuro consulted, thinks likely 1/1 hypotension and electrolyte abnormalities. No further seizure-like activity  # Opioid dependence - have re-started home suboxone given improved kidney function - hydromorphone prn  # Bipolar - cont home seroquel   # BPH - cont home flomax  DVT prophylaxis: heparin Code Status: full Family Communication: attempted to update daughter Morrie Sheldon [phone]) 2/3, no answer  Level of care: Med-Surg Status is: Inpatient  Remains inpatient appropriate because:Inpatient level of care appropriate due to severity of illness   Dispo: The patient is from: [facility]              Anticipated d/c is to: tbd              Anticipated d/c date is: 2 or more days              Patient currently is not medically stable to d/c.   Difficult to place patient: no        Consultants:  Neurology (signed off), nephrology, gen surg  Procedures: 2/2 ex lap with small bowel partial resection  Antimicrobials:  Zosyn 2/1>    Subjective: Feeling improved. Left sided abd pain improved. Has appetite. Tolerated breakfast and lunch. No chest pain or dyspnea. Green stool this AM  Objective: Vitals:   07/27/20 2031 07/27/20 2321 07/28/20 0504 07/28/20 1103  BP: 99/87 (!) 141/73 136/67 132/68  Pulse: 93 87 91 82  Resp: 16 18 16 16   Temp: 98.7 F (37.1 C) 98.9 F (37.2 C) 98.3 F  (36.8 C) 98.5 F (36.9 C)  TempSrc: Oral Oral Oral Oral  SpO2: 97% 99% 99% 100%  Weight:      Height:        Intake/Output Summary (Last 24 hours) at 07/28/2020 1328 Last data filed at 07/28/2020 1100 Gross per 24 hour  Intake 3947.45 ml  Output 1950 ml  Net 1997.45 ml   Filed Weights   07/25/20 0300 07/26/20 0500  Weight: 73.4 kg 69.7 kg    Examination:  General exam: Appears calm and mildly uncomfortable Respiratory system: Clear to auscultation. Respiratory effort normal. Cardiovascular system: S1 & S2 heard, RRR. No JVD, murmurs, rubs, gallops or clicks. No pedal edema. Gastrointestinal system: abd not distended, mild ttp left side, stapled left sided incision c/d/i Central nervous system: Alert and oriented. No focal neurological deficits. Extremities: Symmetric 5 x 5 power. Skin: No rashes, lesions or ulcers Psychiatry: Judgement and insight appear normal. Mood & affect appropriate.     Data Reviewed: I have personally reviewed following labs and imaging studies  CBC: Recent Labs  Lab 07/24/20 1945 07/25/20 0456 07/26/20 0547 07/27/20 0609 07/28/20 0643  WBC 11.2* 11.8* 10.2 9.5 11.5*  NEUTROABS 9.3* 9.9*  --  7.3  --   HGB 16.9 14.5 12.5* 10.2* 9.8*  HCT 47.2 39.5 34.3* 29.0* 28.6*  MCV 89.9 89.4 89.8 94.2 96.0  PLT 406* 365 286 267 280   Basic Metabolic Panel: Recent Labs  Lab 07/24/20 2205 07/25/20 0456 07/26/20 0547 07/27/20 0609 07/28/20 0643  NA 125* 125* 131* 134* 133*  K 3.6 3.5 2.9* 3.7 4.4  CL 72* 76* 88* 97* 102  CO2 26 28 28 28 24   GLUCOSE 79 87 99 108* 112*  BUN 105* 112* 106* 47* 18  CREATININE 5.83* 5.61* 4.07* 1.39* 0.87  CALCIUM 7.5* 7.5* 7.3* 7.8* 7.9*  MG 2.6*  --  2.6* 2.8* 2.0  PHOS  --   --   --  2.0* 1.0*   GFR: Estimated Creatinine Clearance: 97.9 mL/min (by C-G formula based on SCr of 0.87 mg/dL). Liver Function Tests: Recent Labs  Lab 07/24/20 1945 07/25/20 0456 07/27/20 0609 07/28/20 0643  AST 34 25 24 25    ALT 15 13 14 15   ALKPHOS 54 46 36* 38  BILITOT 1.4* 1.7* 0.8 0.5  PROT 7.4 6.7 5.8* 5.6*  ALBUMIN 3.5 3.7 2.7* 2.7*   No results for input(s): LIPASE, AMYLASE in the last 168 hours. No results for input(s): AMMONIA in the last 168 hours. Coagulation Profile: Recent Labs  Lab 07/24/20 1945 07/25/20 0456  INR 1.3* 1.4*   Cardiac Enzymes: No results for input(s): CKTOTAL, CKMB, CKMBINDEX, TROPONINI in the last 168 hours. BNP (last 3 results) No results for input(s): PROBNP in the last 8760 hours. HbA1C: No results for input(s): HGBA1C in the last 72 hours. CBG: Recent Labs  Lab 07/27/20 0637 07/27/20 1200 07/27/20 2323 07/28/20 0639 07/28/20 1148  GLUCAP 109* 100* 104* 95 100*   Lipid Profile: Recent Labs  07/27/20 0609  TRIG 132   Thyroid Function Tests: No results for input(s): TSH, T4TOTAL, FREET4, T3FREE, THYROIDAB in the last 72 hours. Anemia Panel: No results for input(s): VITAMINB12, FOLATE, FERRITIN, TIBC, IRON, RETICCTPCT in the last 72 hours. Urine analysis: No results found for: COLORURINE, APPEARANCEUR, LABSPEC, PHURINE, GLUCOSEU, HGBUR, BILIRUBINUR, KETONESUR, PROTEINUR, UROBILINOGEN, NITRITE, LEUKOCYTESUR Sepsis Labs: @LABRCNTIP (procalcitonin:4,lacticidven:4)  ) Recent Results (from the past 240 hour(s))  SARS Coronavirus 2 by RT PCR (hospital order, performed in [facility] hospital lab) Nasopharyngeal Nasopharyngeal Swab     Status: None   Collection Time: 07/24/20  7:46 PM   Specimen: Nasopharyngeal Swab  Result Value Ref Range Status   SARS Coronavirus 2 NEGATIVE NEGATIVE Final    Comment: (NOTE) SARS-CoV-2 target nucleic acids are NOT DETECTED.  The SARS-CoV-2 RNA is generally detectable in upper and lower respiratory specimens during the acute phase of infection. The lowest concentration of SARS-CoV-2 viral copies this assay can detect is 250 copies / mL. A negative result does not preclude SARS-CoV-2 infection and should not be used as  the sole basis for treatment or other patient management decisions.  A negative result may occur with improper specimen collection / handling, submission of specimen other than nasopharyngeal swab, presence of viral mutation(s) within the areas targeted by this assay, and inadequate number of viral copies (<250 copies / mL). A negative result must be combined with clinical observations, patient history, and epidemiological information.  Fact Sheet for Patients:   BoilerBrush.com.cy  Fact Sheet for Healthcare Providers: https://pope.com/  This test is not yet approved or  cleared by the [place] FDA and has been authorized for detection and/or diagnosis of SARS-CoV-2 by FDA under an Emergency Use Authorization (EUA).  This EUA will remain in effect (meaning this test can be used) for the duration of the COVID-19 declaration under Section 564(b)(1) of the Act, 21 U.S.C. section 360bbb-3(b)(1), unless the authorization is terminated or revoked sooner.  Performed at [facility], [address] [place], [place] [address]   Blood culture (routine x 2)     Status: None (Preliminary result)   Collection Time: 07/24/20 10:05 PM   Specimen: BLOOD  Result Value Ref Range Status   Specimen Description BLOOD LEFT ANTECUBITAL  Final   Special Requests   Final    BOTTLES DRAWN AEROBIC AND ANAEROBIC Blood Culture results may not be optimal due to an inadequate volume of blood received in culture bottles   Culture   Final    NO GROWTH 4 DAYS Performed at [facility], [address], [place], [place] [address]    Report Status PENDING  Incomplete  Blood culture (routine x 2)     Status: None (Preliminary result)   Collection Time: 07/24/20 10:05 PM   Specimen: BLOOD  Result Value Ref Range Status   Specimen Description BLOOD RIGHT ANTECUBITAL  Final   Special Requests   Final    BOTTLES DRAWN AEROBIC AND ANAEROBIC  Blood Culture results may not be optimal due to an inadequate volume of blood received in culture bottles   Culture   Final    NO GROWTH 4 DAYS Performed at [facility], [address] [place], [place] [address]    Report Status PENDING  Incomplete  MRSA PCR Screening     Status: None   Collection Time: 07/25/20  3:00 AM   Specimen: Nasopharyngeal  Result Value Ref Range Status   MRSA by PCR NEGATIVE NEGATIVE Final  Comment:        The GeneXpert MRSA Assay (FDA approved for NASAL specimens only), is one component of a comprehensive MRSA colonization surveillance program. It is not intended to diagnose MRSA infection nor to guide or monitor treatment for MRSA infections. Performed at [facility], [address] [place], [place] [address]          Radiology Studies: DG ABD ACUTE 2+V W 1V CHEST  Result Date: 07/27/2020 CLINICAL DATA:  Ileus EXAM: DG ABDOMEN ACUTE WITH 1 VIEW CHEST COMPARISON:  07/26/2020 FINDINGS: Minimal left basilar atelectasis. Lungs are otherwise clear. No pneumothorax or pleural effusion. Right upper extremity PICC line tip noted within the superior vena cava. Cardiac size within normal limits. Pulmonary vascularity is normal. Multiple healed left rib fractures noted. Nasogastric tube extends into the left upper quadrant of the abdomen. Free intraperitoneal gas is again identified, though appears decreased when compared to prior examination. Multiple gas-filled nondilated loops of large and small bowel are identified with gas extending into the rectal vault suggesting changes of a mild underlying ileus. Surgical skin staples overlie the left flank. No organomegaly. T12 kyphoplasty has been performed. IMPRESSION: Nasogastric tube in appropriate position. Decreasing pneumoperitoneum. Mild ileus. Electronically Signed   By: Helyn Numbers MD   On: 07/27/2020 08:48        Scheduled Meds: . buprenorphine-naloxone  1 tablet Sublingual BID   . Chlorhexidine Gluconate Cloth  6 each Topical Q0600  . heparin  5,000 Units Subcutaneous Q8H  . sodium chloride flush  10-40 mL Intracatheter Q12H  . tamsulosin  0.8 mg Oral Daily   Continuous Infusions: . sodium chloride 35 mL/hr at 07/28/20 1030  . piperacillin-tazobactam (ZOSYN)  IV 3.375 g (07/28/20 1327)  . sodium chloride Stopped (07/25/20 2019)  . sodium phosphate  Dextrose 5% IVPB 30 mmol (07/28/20 1325)  . TPN ADULT (ION) 60 mL/hr at 07/28/20 0400  . TPN ADULT (ION)       LOS: 4 days    Time spent: 40 min    Silvano Bilis, MD [facility]   If 7PM-7AM, please contact night-coverage [email or web address] Password [email or web address] 07/28/2020, 1:28 PM

## 2020-07-28 NOTE — Consult Note (Signed)
PHARMACY - TOTAL PARENTERAL NUTRITION CONSULT NOTE   Indication: Small bowel obstruction s/p laporotomy  Patient Measurements: Height: 6' (182.9 cm) Weight: 69.7 kg (153 lb 10.6 oz) IBW/kg (Calculated) : 77.6   Body mass index is 20.84 kg/m. Usual Weight: 69.7 kg  Assessment:  55yo WM with history of seizure d/o, HTN, migraines, asthma, BPH, anxiety/MDD, polysubstance abuse, and lumbar disc disease presenting with acute seizure followed by post-ictal confusion, hypotension, and lower abd'l pain with N/V. Abd/pelvis CT revealed hernia, incarcerated bowel and is now s/p correction with emergent laparotomy. Pt now on medical floor, NPO and pharmacy consulted for management of TPN.  Glucose / Insulin: 24-hr trend 91 - 109 mg/dL (no insulin required) Electrolytes: hyponatremia, hypophosphatemia Renal:  5.61-->1.3 > 0.87 (BL 0.76 - 06/14/2020) LFTs / TGs: AST/ALT - wnl  /  TG 132 Prealbumin / albumin: ___  /  3.7 > 2.7 Intake / Output:  02/03 0701 - 02/04 0700 In: 230 [P.O.:30; I.V.:200] Out: 4701 [Urine:3550; Emesis/NG output:1150; Stool:1]  MIVF: NS+92meq KCL @100ml /hr > NS @30  mL/hr.   GI Imaging: 2/01 - CT Abd/Pelvis: Spigelian hernia in the left abdominal wall lateral to the rectus muscle with an incarcerated loop of small bowel causing more proximal small bowel and gastric distension. The abdominal wall defect was seen on the prior exam although no herniation was noted at that time. Surgeries / Procedures:  s/p exploratory laparotomy and small bowel resectionfor strangulated incisional hernia 02/01  Central access: 07/26/20 TPN start date: 07/26/20  Nutritional Goals (per RD recommendation on 2/03): kCal: 2-2.3, Protein: 100-115, Fluid: 2-2.3 L  Current Nutrition:  NPO and TPN ; ice chips  Plan:   Will continue TPN at 52mL/hr at 1800 (total volume 1540 mL) - due to high refeed risk.   Nutritional Components  Amino acids (Travasol 10%): 50 g/L (72 grams  total)  Dextrose: 25 % (360 grams total)  1512 kCal/24 hours  Electrolytes in TPN (standard): 65mEq/L of Na, 34mEq/L of K, 28mEq/L of Ca, 58mEq/L of Mg, and 86mmol/L of Phos. Cl:Ac ratio 1:1  Add standard MVI and trace elements to TPN  Hold lipids for 7-10 days due to national shortage  100 mg IV thiamine added to TPN x 3 days (day 2)  continue Sensitive q6h SSI and adjust as needed   30 mmol IV sodium phosphate x 1  Monitor TPN labs on Mon/Thurs, daily until stable   4m 07/28/2020,10:00 AM

## 2020-07-29 DIAGNOSIS — K56601 Complete intestinal obstruction, unspecified as to cause: Secondary | ICD-10-CM | POA: Diagnosis not present

## 2020-07-29 LAB — COMPREHENSIVE METABOLIC PANEL
ALT: 17 U/L (ref 0–44)
AST: 24 U/L (ref 15–41)
Albumin: 2.8 g/dL — ABNORMAL LOW (ref 3.5–5.0)
Alkaline Phosphatase: 40 U/L (ref 38–126)
Anion gap: 9 (ref 5–15)
BUN: 7 mg/dL (ref 6–20)
CO2: 21 mmol/L — ABNORMAL LOW (ref 22–32)
Calcium: 8 mg/dL — ABNORMAL LOW (ref 8.9–10.3)
Chloride: 102 mmol/L (ref 98–111)
Creatinine, Ser: 0.77 mg/dL (ref 0.61–1.24)
GFR, Estimated: >60 mL/min (ref >60)
Glucose, Bld: 86 mg/dL (ref 70–99)
Potassium: 4.3 mmol/L (ref 3.5–5.1)
Sodium: 132 mmol/L — ABNORMAL LOW (ref 135–145)
Total Bilirubin: 0.4 mg/dL (ref 0.3–1.2)
Total Protein: 5.8 g/dL — ABNORMAL LOW (ref 6.5–8.1)

## 2020-07-29 LAB — CBC
HCT: 26.1 % — ABNORMAL LOW (ref 39.0–52.0)
Hemoglobin: 9.2 g/dL — ABNORMAL LOW (ref 13.0–17.0)
MCH: 33 pg (ref 26.0–34.0)
MCHC: 35.2 g/dL (ref 30.0–36.0)
MCV: 93.5 fL (ref 80.0–100.0)
Platelets: 305 10*3/uL (ref 150–400)
RBC: 2.79 MIL/uL — ABNORMAL LOW (ref 4.22–5.81)
RDW: 12.2 % (ref 11.5–15.5)
WBC: 12 10*3/uL — ABNORMAL HIGH (ref 4.0–10.5)
nRBC: 0 % (ref 0.0–0.2)

## 2020-07-29 LAB — GLUCOSE, CAPILLARY
Glucose-Capillary: 86 mg/dL (ref 70–99)
Glucose-Capillary: 117 mg/dL — ABNORMAL HIGH (ref 70–99)

## 2020-07-29 LAB — PHOSPHORUS: Phosphorus: 1.9 mg/dL — ABNORMAL LOW (ref 2.5–4.6)

## 2020-07-29 LAB — CULTURE, BLOOD (ROUTINE X 2)
Culture: NO GROWTH
Culture: NO GROWTH

## 2020-07-29 MED ORDER — SODIUM PHOSPHATES 45 MMOLE/15ML IV SOLN
25.0000 mmol | Freq: Once | INTRAVENOUS | Status: AC
  Administered 2020-07-29: 25 mmol via INTRAVENOUS
  Filled 2020-07-29: qty 8.33

## 2020-07-29 MED ORDER — ENOXAPARIN SODIUM 40 MG/0.4ML ~~LOC~~ SOLN
40.0000 mg | SUBCUTANEOUS | Status: DC
  Administered 2020-07-30: 40 mg via SUBCUTANEOUS
  Filled 2020-07-29: qty 0.4

## 2020-07-29 MED ORDER — SODIUM PHOSPHATES 45 MMOLE/15ML IV SOLN
30.0000 mmol | Freq: Once | INTRAVENOUS | Status: DC
  Filled 2020-07-29: qty 10

## 2020-07-29 NOTE — Progress Notes (Signed)
[facility] SURGICAL PROGRESS NOTE  Hospital Day(s): 5.   Post op day(s): 5 Days Post-Op.   Interval History:  Patient seen and examined No acute events or new complaints overnight.  No c/o pain.  He denies nausea and is tolerating full liquids well.  No fever, chills,  He has reported passing flatus   Vital signs in last 24 hours: [min-max] current  Temp:  [97.7 F (36.5 C)-98.5 F (36.9 C)] 98.3 F (36.8 C) (02/06 0451) Pulse Rate:  [82-100] 98 (02/06 0451) Resp:  [16-20] 20 (02/06 0451) BP: (92-132)/(64-84) 99/83 (02/06 0451) SpO2:  [98 %-100 %] 98 % (02/06 0451)     Height: 6' (182.9 cm) Weight: 69.7 kg BMI (Calculated): 20.84   Intake/Output last 2 shifts:  02/05 0701 - 02/06 0700 In: 3178.7 [P.O.:1210; I.V.:1556.7; IV Piggyback:411.9] Out: 4300 [Urine:4300]   Physical Exam:  Constitutional: alert, cooperative and no distress  HEENT: NGT in place; output appears dilute Respiratory: breathing non-labored at rest  Cardiovascular: regular rate and sinus rhythm  Gastrointestinal: Soft, non-distended, no rebound/guarding. He does have a reducible and soft umbilical area hernia present.  Integumentary: Left mid abdomen incision is CDI with staples, no erythema, minimal drainage on dressing  Labs:  CBC Latest Ref Rng & Units 07/29/2020 07/28/2020 07/27/2020  WBC 4.0 - 10.5 K/uL 12.0(H) 11.5(H) 9.5  Hemoglobin 13.0 - 17.0 g/dL 7.0(Y) 1.7(C) 10.2(L)  Hematocrit 39.0 - 52.0 % 26.1(L) 28.6(L) 29.0(L)  Platelets 150 - 400 K/uL 305 280 267   CMP Latest Ref Rng & Units 07/29/2020 07/28/2020 07/27/2020  Glucose 70 - 99 mg/dL 86 944(H) 675(F)  BUN 6 - 20 mg/dL 7 18 16(B)  Creatinine 0.61 - 1.24 mg/dL 8.46 6.59 9.35(T)  Sodium 135 - 145 mmol/L 132(L) 133(L) 134(L)  Potassium 3.5 - 5.1 mmol/L 4.3 4.4 3.7  Chloride 98 - 111 mmol/L 102 102 97(L)  CO2 22 - 32 mmol/L 21(L) 24 28  Calcium 8.9 - 10.3 mg/dL 8.0(L) 7.9(L) 7.8(L)  Total Protein 6.5 - 8.1 g/dL 0.1(X) 5.6(L)  5.8(L)  Total Bilirubin 0.3 - 1.2 mg/dL 0.4 0.5 0.8  Alkaline Phos 38 - 126 U/L 40 38 36(L)  AST 15 - 41 U/L 24 25 24   ALT 0 - 44 U/L 17 15 14     Imaging studies:   KUB + CXR (07/27/2020) personally reviewed with air seen throughout the colon, mild gastric dilation, improved small bowel dilation in left mid abdomen, and radiologist report reviewed:  IMPRESSION: Nasogastric tube in appropriate position.   Decreasing pneumoperitoneum.   Mild ileus.   Assessment/Plan:  55 y.o. male with clinically improving post-operative ileus 5 Days Post-Op s/p exploratory laparotomy and small bowel resection for strangulated incisional hernia, complicated by, now improving, AKI secondary to likely septic shock   - May advance toward regular diet;              - Continue IV ABx (Zosyn)             - Pain control prn; antiemetics prn             - Mobilization as tolerated; low threshold to engage PT             - Further management per primary service; we will follow   All of the above findings and recommendations were discussed with the patient, and the medical team, and all of patient's questions were answered to his expressed satisfaction.  -- 09/24/2020, M.D., [facility]  07/29/2020 ; 8:57 AM

## 2020-07-29 NOTE — Progress Notes (Signed)
PROGRESS NOTE    Alan Nelson  MWN:[id number] DOB: 10-Feb-1966 DOA: 07/24/2020 PCP: Burnis Medin, PA-C  Outpatient Specialists: none    Brief Narrative:   Alan Nelson  is a 55 y.o. Caucasian male with a known history of seizure disorder, hypertension and migraine as well as asthma, BPH, anxiety/depression, polysubstance abuse and lumbar disc disease, who presented to the emergency room with acute onset of seizure followed by postictal phase with confusion.  He was found hypotensive with a blood pressure in the 50s systolic, complaining of abdominal pain mainly in the lower abdomen with associated nausea and vomiting.  He admitted to tactile fever and chills.  He has been having mild headache.  His abdominal pain is started on Thursday and got significantly worse today.  No chest pain or palpitations.  No bleeding diathesis.  Upon presentation to the ER, blood pressure was 77/64 with otherwise normal vital signs.  Later respiratory rate was 22 then 29 and with hydration blood pressure was up to 92/77 and later 126/99.  Labs revealed CMP with hyper kalemia, hyponatremia hypochloremia BUN of 102 and creatinine of 5.25 with a calcium of 7 indirect bili of 1.2 and total bili 1.4, high-sensitivity troponin I 145 lactic acid 4.9 with CBC showing leukocytosis of 11.2 with mild neutrophilia and thrombocytosis with INR 1.3 and PT 15.5.  COVID-19 PCR came back negative.  Portable chest ray showed minor bibasilar atelectasis.  Head CT scan revealed no acute abnormalities.  C-spine CT showed no fracture or subluxation.    Assessment & Plan:   Active Problems:   Bowel obstruction (HCC)   Protein-calorie malnutrition, severe  # Incarcerated small bowel hernia # Hypotension # Post-op ileus # Melena # Partial small bowel resection S/p emergent ex lap with partial small bowel resection 2/2. NG tube removed 2/4, now tolerating soft diet. Green stools persist, hgb stable.   - continue to advance  diet - cont zosyn (2/1> ) - hydromorphone, zofran prn - note has picc - trend h/h - will touch base w/ gen surg, possible d/c tomorrow  # Acute kidney injury # Hyponatremia # Hypokalemia # Hypophosphatemia Cr 5s on arrival, prior normal. Likely some degree of ATN 2/2 profound hypotension that is now resolved. Cr today improved to wnl. Renal u/s unremarkable.  - monitor  # Seizure Hx seizure, presented post-ictal, likely precipitated by above acute illness. Post-ictal state has resolved. EEG wnl. Neuro consulted, thinks likely 1/1 hypotension and electrolyte abnormalities. No further seizure-like activity  # Opioid dependence # Cocaine abuse # History alcohol abuse - have re-started home suboxone given improved kidney function - hydromorphone prn  # Bipolar - cont home seroquel   # BPH - cont home flomax  DVT prophylaxis: lovenox Code Status: full Family Communication: updated daughter Morrie Sheldon [phone]) 2/6  Level of care: Med-Surg Status is: Inpatient  Remains inpatient appropriate because:Inpatient level of care appropriate due to severity of illness   Dispo: The patient is from: [facility]              Anticipated d/c is to: tbd              Anticipated d/c date is: 1 day              Patient currently is not medically stable to d/c.   Difficult to place patient: no        Consultants:  Neurology (signed off), nephrology, gen surg  Procedures: 2/2 ex lap with small bowel  partial resection  Antimicrobials:  Zosyn 2/1>    Subjective: Feeling improved. Left sided abd pain improving. Has appetite. Tolerated breakfast and lunch, soft diet. No chest pain or dyspnea. Green loose stools continue.  Objective: Vitals:   07/28/20 2101 07/28/20 2343 07/29/20 0451 07/29/20 1200  BP: 108/73 92/64 99/83  92/61  Pulse: 100 99 98 80  Resp: 20 20 20 18   Temp: 98.4 F (36.9 C) 97.7 F (36.5 C) 98.3 F (36.8 C) 98 F (36.7 C)  TempSrc: Oral  Oral  Oral  SpO2: 100% 99% 98% 95%  Weight:      Height:        Intake/Output Summary (Last 24 hours) at 07/29/2020 1228 Last data filed at 07/29/2020 1016 Gross per 24 hour  Intake 2568.68 ml  Output 3750 ml  Net -1181.32 ml   Filed Weights   07/25/20 0300 07/26/20 0500  Weight: 73.4 kg 69.7 kg    Examination:  General exam: Appears calm and mildly uncomfortable Respiratory system: Clear to auscultation. Respiratory effort normal. Cardiovascular system: S1 & S2 heard, RRR. No JVD, murmurs, rubs, gallops or clicks. No pedal edema. Gastrointestinal system: abd not distended, mild ttp left side, stapled left sided incision c/d/i Central nervous system: Alert and oriented. No focal neurological deficits. Extremities: Symmetric 5 x 5 power. Skin: No rashes, lesions or ulcers Psychiatry: Judgement and insight appear normal. Mood & affect appropriate.     Data Reviewed: I have personally reviewed following labs and imaging studies  CBC: Recent Labs  Lab 07/24/20 1945 07/25/20 0456 07/26/20 0547 07/27/20 0609 07/28/20 0643 07/29/20 0454  WBC 11.2* 11.8* 10.2 9.5 11.5* 12.0*  NEUTROABS 9.3* 9.9*  --  7.3  --   --   HGB 16.9 14.5 12.5* 10.2* 9.8* 9.2*  HCT 47.2 39.5 34.3* 29.0* 28.6* 26.1*  MCV 89.9 89.4 89.8 94.2 96.0 93.5  PLT 406* 365 286 267 280 305   Basic Metabolic Panel: Recent Labs  Lab 07/24/20 2205 07/25/20 0456 07/26/20 0547 07/27/20 0609 07/28/20 0643 07/29/20 0454  NA 125* 125* 131* 134* 133* 132*  K 3.6 3.5 2.9* 3.7 4.4 4.3  CL 72* 76* 88* 97* 102 102  CO2 26 28 28 28 24  21*  GLUCOSE 79 87 99 108* 112* 86  BUN 105* 112* 106* 47* 18 7  CREATININE 5.83* 5.61* 4.07* 1.39* 0.87 0.77  CALCIUM 7.5* 7.5* 7.3* 7.8* 7.9* 8.0*  MG 2.6*  --  2.6* 2.8* 2.0  --   PHOS  --   --   --  2.0* 1.0* 1.9*   GFR: Estimated Creatinine Clearance: 106.5 mL/min (by C-G formula based on SCr of 0.77 mg/dL). Liver Function Tests: Recent Labs  Lab 07/24/20 1945 07/25/20 0456  07/27/20 0609 07/28/20 0643 07/29/20 0454  AST 34 25 24 25 24   ALT 15 13 14 15 17   ALKPHOS 54 46 36* 38 40  BILITOT 1.4* 1.7* 0.8 0.5 0.4  PROT 7.4 6.7 5.8* 5.6* 5.8*  ALBUMIN 3.5 3.7 2.7* 2.7* 2.8*   No results for input(s): LIPASE, AMYLASE in the last 168 hours. No results for input(s): AMMONIA in the last 168 hours. Coagulation Profile: Recent Labs  Lab 07/24/20 1945 07/25/20 0456  INR 1.3* 1.4*   Cardiac Enzymes: No results for input(s): CKTOTAL, CKMB, CKMBINDEX, TROPONINI in the last 168 hours. BNP (last 3 results) No results for input(s): PROBNP in the last 8760 hours. HbA1C: No results for input(s): HGBA1C in the last 72 hours. CBG: Recent Labs  Lab  07/28/20 1148 07/28/20 1814 07/28/20 2341 07/29/20 0543 07/29/20 1155  GLUCAP 100* 76 101* 86 117*   Lipid Profile: Recent Labs    07/27/20 0609  TRIG 132   Thyroid Function Tests: No results for input(s): TSH, T4TOTAL, FREET4, T3FREE, THYROIDAB in the last 72 hours. Anemia Panel: No results for input(s): VITAMINB12, FOLATE, FERRITIN, TIBC, IRON, RETICCTPCT in the last 72 hours. Urine analysis: No results found for: COLORURINE, APPEARANCEUR, LABSPEC, PHURINE, GLUCOSEU, HGBUR, BILIRUBINUR, KETONESUR, PROTEINUR, UROBILINOGEN, NITRITE, LEUKOCYTESUR Sepsis Labs: @LABRCNTIP (procalcitonin:4,lacticidven:4)  ) Recent Results (from the past 240 hour(s))  SARS Coronavirus 2 by RT PCR (hospital order, performed in [facility] hospital lab) Nasopharyngeal Nasopharyngeal Swab     Status: None   Collection Time: 07/24/20  7:46 PM   Specimen: Nasopharyngeal Swab  Result Value Ref Range Status   SARS Coronavirus 2 NEGATIVE NEGATIVE Final    Comment: (NOTE) SARS-CoV-2 target nucleic acids are NOT DETECTED.  The SARS-CoV-2 RNA is generally detectable in upper and lower respiratory specimens during the acute phase of infection. The lowest concentration of SARS-CoV-2 viral copies this assay can detect is 250 copies / mL.  A negative result does not preclude SARS-CoV-2 infection and should not be used as the sole basis for treatment or other patient management decisions.  A negative result may occur with improper specimen collection / handling, submission of specimen other than nasopharyngeal swab, presence of viral mutation(s) within the areas targeted by this assay, and inadequate number of viral copies (<250 copies / mL). A negative result must be combined with clinical observations, patient history, and epidemiological information.  Fact Sheet for Patients:   09/21/20  Fact Sheet for Healthcare Providers: BoilerBrush.com.cy  This test is not yet approved or  cleared by the https://pope.com/ FDA and has been authorized for detection and/or diagnosis of SARS-CoV-2 by FDA under an Emergency Use Authorization (EUA).  This EUA will remain in effect (meaning this test can be used) for the duration of the COVID-19 declaration under Section 564(b)(1) of the Act, 21 U.S.C. section 360bbb-3(b)(1), unless the authorization is terminated or revoked sooner.  Performed at [facility], [address] [place], [place]   Blood culture (routine x 2)     Status: None   Collection Time: 07/24/20 10:05 PM   Specimen: BLOOD  Result Value Ref Range Status   Specimen Description BLOOD LEFT ANTECUBITAL  Final   Special Requests   Final    BOTTLES DRAWN AEROBIC AND ANAEROBIC Blood Culture results may not be optimal due to an inadequate volume of blood received in culture bottles   Culture   Final    NO GROWTH 5 DAYS Performed at [facility], [address] [place], [place]    Report Status 07/29/2020 FINAL  Final  Blood culture (routine x 2)     Status: None   Collection Time: 07/24/20 10:05 PM   Specimen: BLOOD  Result Value Ref Range Status   Specimen Description BLOOD RIGHT ANTECUBITAL  Final   Special Requests    Final    BOTTLES DRAWN AEROBIC AND ANAEROBIC Blood Culture results may not be optimal due to an inadequate volume of blood received in culture bottles   Culture   Final    NO GROWTH 5 DAYS Performed at [facility], [address] [place], [place]    Report Status 07/29/2020 FINAL  Final  MRSA PCR Screening     Status: None   Collection Time: 07/25/20  3:00 AM   Specimen: Nasopharyngeal  Result Value Ref Range Status   MRSA by PCR NEGATIVE NEGATIVE Final    Comment:        The GeneXpert MRSA Assay (FDA approved for NASAL specimens only), is one component of a comprehensive MRSA colonization surveillance program. It is not intended to diagnose MRSA infection nor to guide or monitor treatment for MRSA infections. Performed at [facility], [address] [place], [place] [address]          Radiology Studies: No results found.      Scheduled Meds: . buprenorphine-naloxone  1 tablet Sublingual BID  . Chlorhexidine Gluconate Cloth  6 each Topical Q0600  . heparin  5,000 Units Subcutaneous Q8H  . pantoprazole  40 mg Oral Daily  . QUEtiapine  200 mg Oral QHS  . sodium chloride flush  10-40 mL Intracatheter Q12H  . tamsulosin  0.8 mg Oral Daily   Continuous Infusions: . sodium chloride Stopped (07/29/20 0501)  . piperacillin-tazobactam (ZOSYN)  IV 12.5 mL/hr at 07/29/20 0511  . sodium phosphate  Dextrose 5% IVPB       LOS: 5 days    Time spent: 40 min    Silvano Bilis, MD [facility]   If 7PM-7AM, please contact night-coverage [email or web address] Password [facility] 07/29/2020, 12:28 PM

## 2020-07-29 NOTE — Plan of Care (Signed)
Continuing with plan of care. 

## 2020-07-29 NOTE — Progress Notes (Signed)
Mobility Specialist - Progress Note   07/29/20 1300  Mobility  Activity Ambulated in hall  Level of Assistance Independent  Assistive Device None (Pt pushed IV pole)  Distance Ambulated (ft) 360 ft  Mobility Response Tolerated well  Mobility performed by Mobility specialist  $Mobility charge 1 Mobility    Pre-mobility: 106 HR, 98% SpO2 During mobility: 113 HR, 96% SpO2 Post-mobility: 100 HR, 98% SpO2   Pt was standing at bedside with guard present. Pt utilizing room air. Pt agreed to session. Pt denied pain and nausea, but did c/o fatigue d/t feeling "droggy" from medication. Pt voiced his hunger and mobility helped pt call dining services to place lunch order. Pt ambulated 360' in hallway without use of AD. Pt pushed own IV pole. No LOB noted. After ~20', pt c/o dizziness. A short standing rest break was taken to resolve issue. Pt denied SOB. Mobility did educate pt on PLB techniques. Upon return to room, pt did c/o fatigue. Mobility attempted to get pt's rated RPE, however, pt would only respond "I'm just really tired". Overall, pt tolerated session well. Pt was left EOB with all needs in reach. Guard still present at exit.    Filiberto Pinks Mobility Specialist 07/29/20, 1:41 PM'

## 2020-07-30 DIAGNOSIS — K56601 Complete intestinal obstruction, unspecified as to cause: Secondary | ICD-10-CM | POA: Diagnosis not present

## 2020-07-30 LAB — CBC
HCT: 27.1 % — ABNORMAL LOW (ref 39.0–52.0)
Hemoglobin: 9.2 g/dL — ABNORMAL LOW (ref 13.0–17.0)
MCH: 32.1 pg (ref 26.0–34.0)
MCHC: 33.9 g/dL (ref 30.0–36.0)
MCV: 94.4 fL (ref 80.0–100.0)
Platelets: 365 10*3/uL (ref 150–400)
RBC: 2.87 MIL/uL — ABNORMAL LOW (ref 4.22–5.81)
RDW: 12.2 % (ref 11.5–15.5)
WBC: 11.4 10*3/uL — ABNORMAL HIGH (ref 4.0–10.5)
nRBC: 0 % (ref 0.0–0.2)

## 2020-07-30 LAB — COMPREHENSIVE METABOLIC PANEL
ALT: 20 U/L (ref 0–44)
AST: 25 U/L (ref 15–41)
Albumin: 3 g/dL — ABNORMAL LOW (ref 3.5–5.0)
Alkaline Phosphatase: 44 U/L (ref 38–126)
Anion gap: 7 (ref 5–15)
BUN: 12 mg/dL (ref 6–20)
CO2: 24 mmol/L (ref 22–32)
Calcium: 8.1 mg/dL — ABNORMAL LOW (ref 8.9–10.3)
Chloride: 104 mmol/L (ref 98–111)
Creatinine, Ser: 0.92 mg/dL (ref 0.61–1.24)
GFR, Estimated: >60 mL/min (ref >60)
Glucose, Bld: 99 mg/dL (ref 70–99)
Potassium: 4.5 mmol/L (ref 3.5–5.1)
Sodium: 135 mmol/L (ref 135–145)
Total Bilirubin: 0.3 mg/dL (ref 0.3–1.2)
Total Protein: 6.3 g/dL — ABNORMAL LOW (ref 6.5–8.1)

## 2020-07-30 LAB — MAGNESIUM: Magnesium: 1.6 mg/dL — ABNORMAL LOW (ref 1.7–2.4)

## 2020-07-30 LAB — PHOSPHORUS: Phosphorus: 2.6 mg/dL (ref 2.5–4.6)

## 2020-07-30 MED ORDER — OXYCODONE HCL 5 MG PO TABS: 5.0000 mg | ORAL_TABLET | Freq: Four times a day (QID) | ORAL | 0 refills | Status: DC | PRN

## 2020-07-30 MED ORDER — PANTOPRAZOLE SODIUM 40 MG PO TBEC: 40.0000 mg | DELAYED_RELEASE_TABLET | Freq: Every day | ORAL | 1 refills | Status: DC

## 2020-07-30 MED ORDER — AMOXICILLIN-POT CLAVULANATE 875-125 MG PO TABS: 1.0000 | ORAL_TABLET | Freq: Two times a day (BID) | ORAL | 0 refills | Status: AC

## 2020-07-30 MED ORDER — OXYCODONE HCL 5 MG PO TABS
5.0000 mg | ORAL_TABLET | Freq: Once | ORAL | Status: AC
  Administered 2020-07-30: 5 mg via ORAL
  Filled 2020-07-30: qty 1

## 2020-07-30 NOTE — Discharge Summary (Addendum)
Alan Nelson ZOX:[id number] DOB: 08-May-1966 DOA: 07/24/2020  PCP: Burnis Medin, PA-C  Admit date: 07/24/2020 Discharge date: 07/30/2020  Time spent: 35 minutes  Recommendations for Outpatient Follow-up:  1. Will need follow-up in 7 days with [facility] for staple removal and check-up     Discharge Diagnoses:  Active Problems:   Bowel obstruction (HCC)   Protein-calorie malnutrition, severe   Discharge Condition: fair  Diet recommendation: regular  Filed Weights   07/25/20 0300 07/26/20 0500  Weight: 73.4 kg 69.7 kg    History of present illness:  Alan Nelson  is a 55 y.o. Caucasian male with a known history of seizure disorder, hypertension and migraine as well as asthma, BPH, anxiety/depression, polysubstance abuse and lumbar disc disease, who presented to the emergency room with acute onset of seizure followed by postictal phase with confusion.  He was found hypotensive with a blood pressure in the 50s systolic, complaining of abdominal pain mainly in the lower abdomen with associated nausea and vomiting.  He admitted to tactile fever and chills.  He has been having mild headache.  His abdominal pain is started on Thursday and got significantly worse today.  No chest pain or palpitations.  No bleeding diathesis.  Upon presentation to the ER, blood pressure was 77/64 with otherwise normal vital signs.  Later respiratory rate was 22 then 29 and with hydration blood pressure was up to 92/77 and later 126/99.  Labs revealed CMP with hyper kalemia, hyponatremia hypochloremia BUN of 102 and creatinine of 5.25 with a calcium of 7 indirect bili of 1.2 and total bili 1.4, high-sensitivity troponin I 145 lactic acid 4.9 with CBC showing leukocytosis of 11.2 with mild neutrophilia and thrombocytosis with INR 1.3 and PT 15.5.  COVID-19 PCR came back negative.  Portable chest ray showed minor bibasilar atelectasis.  Head CT scan revealed no acute abnormalities.  C-spine CT  showed no fracture or subluxation.  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 The abdominal wall defect was seen on the prior exam although no herniation was noted at that time.  Hospital Course:  # Incarcerated small bowel hernia # Hypotension # Post-op ileus # Melena # Partial small bowel resection # Septic shock secondary to incarcerated bowel hernia, present on admission S/p emergent ex lap with partial small bowel resection 2/2. NG tube removed 2/4, now tolerating  diet. Green stools persist, hgb stable.  Treated w/ zosyn - gen surg advising 7 more days of augmentin - f/u [facility] 1 week for evaluation, staple removal  # Acute kidney injury # Hyponatremia # Hypokalemia # Hypophosphatemia Cr 5s on arrival, prior normal. Resolved.    # Seizure Hx seizure, presented post-ictal, likely precipitated by above acute illness. Post-ictal state has resolved. EEG wnl. Neuro consulted, thinks likely 1/1 hypotension and electrolyte abnormalities. No further seizure-like activity  # Opioid dependence # Cocaine abuse # History alcohol abuse - cont  home suboxone given improved kidney function - hydromorphone prn for post-op pain  # Bipolar # BPH # GERD - cont home flomax, seroquel, start pantop  Procedures:  Ex lap with partial small bowel resection 07/25/20   Consultations:  gen surg  Discharge Exam: Vitals:   07/29/20 2326 07/30/20 0350  BP: 104/68 104/66  Pulse: (!) 109 (!) 110  Resp: 16 16  Temp: 98.2 F (  36.8 C) 98.2 F (36.8 C)  SpO2: 97% 98%    General exam: Appears calm and mildly uncomfortable Respiratory system: Clear to auscultation. Respiratory effort normal. Cardiovascular system: S1 & S2 heard, RRR. No JVD, murmurs, rubs, gallops or clicks. No pedal  edema. Gastrointestinal system: abd not distended, mild ttp left side, stapled left sided incision c/d/i Central nervous system: Alert and oriented. No focal neurological deficits. Extremities: Symmetric 5 x 5 power. Skin: No rashes, lesions or ulcers Psychiatry: Judgement and insight appear normal. Mood & affect appropriate.   Discharge Instructions   Discharge Instructions    Call MD for:  difficulty breathing, headache or visual disturbances   Complete by: As directed    Call MD for:  persistant dizziness or light-headedness   Complete by: As directed    Call MD for:  persistant nausea and vomiting   Complete by: As directed    Call MD for:  redness, tenderness, or signs of infection (pain, swelling, redness, odor or green/yellow discharge around incision site)   Complete by: As directed    Call MD for:  severe uncontrolled pain   Complete by: As directed    Call MD for:  temperature >100.4   Complete by: As directed    Diet - low sodium heart healthy   Complete by: As directed    Discharge wound care:   Complete by: As directed    Keep clean, dry   Increase activity slowly   Complete by: As directed      Allergies as of 07/30/2020      Reactions   Bupropion Other (See Comments)   Seizure   Desipramine Other (See Comments)   Drunk feeling      Medication List    TAKE these medications   acetaminophen 500 MG tablet Commonly known as: TYLENOL Take 1 tablet (500 mg total) by mouth every 6 (six) hours as needed for mild pain or moderate pain.   amoxicillin-clavulanate 875-125 MG tablet Commonly known as: Augmentin Take 1 tablet by mouth 2 (two) times daily for 14 days.   buprenorphine-naloxone 8-2 mg Subl SL tablet Commonly known as: SUBOXONE Place 1 tablet under the tongue 2 (two) times daily.   ondansetron 4 MG tablet Commonly known as: Zofran Take 1 tablet (4 mg total) by mouth every 8 (eight) hours as needed.   oxyCODONE 5 MG immediate release  tablet Commonly known as: Roxicodone Take 1 tablet (5 mg total) by mouth every 6 (six) hours as needed for breakthrough pain.   pantoprazole 40 MG tablet Commonly known as: PROTONIX Take 1 tablet (40 mg total) by mouth daily.   QUEtiapine 100 MG tablet Commonly known as: SEROQUEL Take 200 mg by mouth at bedtime.   rizatriptan 5 MG tablet Commonly known as: MAXALT Take 5 mg by mouth as needed for migraine. May repeat in 2 hours if needed   tamsulosin 0.4 MG Caps capsule Commonly known as: FLOMAX Take 0.8 mg by mouth daily.            Discharge Care Instructions  (From admission, onward)         Start     Ordered   07/30/20 0000  Discharge wound care:       Comments: Keep clean, dry   07/30/20 0953         Allergies  Allergen Reactions  . Bupropion Other (See Comments)    Seizure   . Desipramine Other (See Comments)    Drunk feeling  Follow-up Information    Donovan Kail, PA-C. Schedule an appointment as soon as possible for a visit in 1 week(s).   Specialty: Physician Assistant Why: s/p incisional hernia repair, for staple removal Contact information: [address] [place] [address] [phone]                The results of significant diagnostics from this hospitalization (including imaging, microbiology, ancillary and laboratory) are listed below for reference.    Significant Diagnostic Studies: EEG  Result Date: 07/25/2020 Charlsie Quest, MD     07/25/2020  3:51 PM Patient Name: Alan Nelson MRN: [id number] Epilepsy Attending: Charlsie Quest Referring Physician/Provider: Dr Ritta Slot Date: 07/24/2020 Duration: 21.07 mins Patient history: 55 year old male with a history of seizures in the setting of Wellbutrin use who presents with reported convulsion in the setting of severe hypotension. EEG to evaluate for seizure Level of alertness: Awake,  asleep AEDs during EEG study: None Technical aspects: This EEG study was  done with scalp electrodes positioned according to the 10-20 International system of electrode placement. Electrical activity was acquired at a sampling rate of 500Hz  and reviewed with a high frequency filter of 70Hz  and a low frequency filter of 1Hz . EEG data were recorded continuously and digitally stored. Description: The posterior dominant rhythm consists of 8 Hz activity of moderate voltage (25-35 uV) seen predominantly in posterior head regions, symmetric and reactive to eye opening and eye closing. Sleep was characterized by vertex waves, sleep spindles (12 to 14 Hz), maximal frontocentral region. Hyperventilation and photic stimulation were not performed.   IMPRESSION: This study is within normal limits. No seizures or epileptiform discharges were seen throughout the recording.   CT ABDOMEN PELVIS WO CONTRAST  Result Date: 07/24/2020 CLINICAL DATA:  Abdominal distension and bloody stools EXAM: CT ABDOMEN AND PELVIS WITHOUT CONTRAST TECHNIQUE: Multidetector CT imaging of the abdomen and pelvis was performed following the standard protocol without IV contrast. COMPARISON:  07/10/2009 FINDINGS: Lower chest: No acute abnormality. Hepatobiliary: No focal liver abnormality is seen. No gallstones, gallbladder wall thickening, or biliary dilatation. Pancreas: Unremarkable. No pancreatic ductal dilatation or surrounding inflammatory changes. 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 Adrenals/Urinary Tract: Adrenal glands are within normal limits bilaterally. The kidneys are well visualized bilaterally. Tiny nonobstructing left renal stones are noted. The bladder is decompressed. Stomach/Bowel: Scattered diverticular change of the colon is noted without evidence of diverticulitis. Colon is predominately decompressed. The appendix is unremarkable. Stomach is significantly distended with fluid and food stuffs. This extends into the jejunum and is  secondary to herniation of the proximal jejunum through a defect in the abdominal wall on the left. There is a hernia identified in the left mid abdomen anteriorly which extends between layers of the abdominal wall musculature. The more distal small bowel is decompressed. Vascular/Lymphatic: Aortic atherosclerosis. No enlarged abdominal or pelvic lymph nodes. Reproductive: Prostate is unremarkable. Other: No abdominal wall hernia or abnormality. No abdominopelvic ascites. Musculoskeletal: Degenerative changes of lumbar spine are noted. 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 The abdominal wall defect was seen on the prior exam although no herniation was noted at that time. Tiny nonobstructing left renal calculi. Changes of prior splenectomy. Electronically Signed   By: Charlsie Quest M.D.   On: 07/24/2020 21:45   CT Head  Wo Contrast  Result Date: 07/24/2020 CLINICAL DATA:  Seizure, nontraumatic (Age >= 41y) EXAM: CT HEAD WITHOUT CONTRAST TECHNIQUE: Contiguous axial images were obtained from the base of the skull through the vertex without intravenous contrast. COMPARISON:  Head CT 11/13/2019 FINDINGS: Brain: Brain volume is normal for age. No intracranial hemorrhage, mass effect, or midline shift. No hydrocephalus. Incidental mega cisterna magna. The basilar cisterns are patent. No evidence of territorial infarct or acute ischemia. No extra-axial or intracranial fluid collection. Vascular: Atherosclerosis of skullbase vasculature without hyperdense vessel or abnormal calcification. Skull: Bilateral parietal craniotomy.  No fracture or focal lesion. Sinuses/Orbits: Paranasal sinuses and mastoid air cells are clear. The visualized orbits are unremarkable. Multiple dental caries are partially included. Other: None. IMPRESSION: No acute intracranial abnormality or explanation  for seizure. Electronically Signed   By: Narda Rutherford M.D.   On: 07/24/2020 21:38   CT Cervical Spine Wo Contrast  Result Date: 07/24/2020 CLINICAL DATA:  Neck trauma, focal neuro deficit or paresthesia (Age 50-64y) EXAM: CT CERVICAL SPINE WITHOUT CONTRAST TECHNIQUE: Multidetector CT imaging of the cervical spine was performed without intravenous contrast. Multiplanar CT image reconstructions were also generated. COMPARISON:  None. FINDINGS: Alignment: Normal. Skull base and vertebrae: No acute fracture. Vertebral body heights are maintained. The dens and skull base are intact. Soft tissues and spinal canal: No prevertebral fluid or swelling. No visible canal hematoma. Disc levels:  Nonacute.  Minor endplate spurring. Upper chest: No acute findings. Other: None. IMPRESSION: No acute fracture or subluxation of the cervical spine. Electronically Signed   By: Narda Rutherford M.D.   On: 07/24/2020 21:40   US RENAL  Result Date: 07/25/2020 CLINICAL DATA:  Acute kidney injury. EXAM: RENAL / URINARY TRACT ULTRASOUND COMPLETE COMPARISON:  CT scan 07/24/2020 FINDINGS: Right Kidney: Renal measurements: 11.5 x 6.9 x 5.5 cm = volume: 226.8 mL. Normal renal cortical thickness and slight increased echogenicity but no worrisome renal lesions or hydronephrosis. Left Kidney: Renal measurements: 9.6 x 5.9 x 5.5 cm = volume: 163.0 mL. Normal renal cortical thickness. Mild diffuse increased echogenicity. No renal lesions or hydronephrosis. Bladder: No mass lesions or calculi.  A Foley catheter is noted. Other: None. IMPRESSION: 1. Normal renal cortical thickness and no renal lesions or hydronephrosis. 2. Mild increased echogenicity of both kidneys. 3. Unremarkable appearance of the bladder. Foley catheter is present. Electronically Signed   By: Rudie Meyer M.D.   On: 07/25/2020 11:57   DG Chest Portable 1 View  Result Date: 07/24/2020 CLINICAL DATA:  Chest pain. EXAM: PORTABLE CHEST 1 VIEW COMPARISON:  Radiograph  11/13/2019 FINDINGS: The cardiomediastinal contours are normal. Minor bibasilar atelectasis. Pulmonary vasculature is normal. No consolidation, pleural effusion, or pneumothorax. Remote left rib fractures. No acute osseous abnormalities are seen. IMPRESSION: Minor bibasilar atelectasis. Electronically Signed   By: Narda Rutherford M.D.   On: 07/24/2020 20:01   DG ABD ACUTE 2+V W 1V CHEST  Result Date: 07/27/2020 CLINICAL DATA:  Ileus EXAM: DG ABDOMEN ACUTE WITH 1 VIEW CHEST COMPARISON:  07/26/2020 FINDINGS: Minimal left basilar atelectasis. Lungs are otherwise clear. No pneumothorax or pleural effusion. Right upper extremity PICC line tip noted within the superior vena cava. Cardiac size within normal limits. Pulmonary vascularity is normal. Multiple healed left rib fractures noted. Nasogastric tube extends into the left upper quadrant of the abdomen. Free intraperitoneal gas is again identified, though appears decreased when compared to prior examination. Multiple gas-filled nondilated loops of large and small bowel are identified with gas extending into the  rectal vault suggesting changes of a mild underlying ileus. Surgical skin staples overlie the left flank. No organomegaly. T12 kyphoplasty has been performed. IMPRESSION: Nasogastric tube in appropriate position. Decreasing pneumoperitoneum. Mild ileus. Electronically Signed   By: Helyn Numbers MD   On: 07/27/2020 08:48   DG ABD ACUTE 2+V W 1V CHEST  Result Date: 07/26/2020 CLINICAL DATA:  Recent surgery EXAM: DG ABDOMEN ACUTE WITH 1 VIEW CHEST COMPARISON:  July 25, 2019 FINDINGS: Cardiomediastinal silhouette is normal in contour. Scattered bibasilar atelectasis. No pleural effusion or pneumothorax. Enteric tube tip and side port project over the stomach. There is pneumoperitoneum, consistent with recent laparotomy. Soft tissue air. Surgical staples. Surgical sutures project over the mid abdomen. Air filled nondilated loops of bowel. Remote bilateral  rib fractures. Status post vertebral augmentation of T12. IMPRESSION: 1. Enteric tube tip and side port project over the stomach. 2. Pneumoperitoneum, consistent with recent laparotomy. No dilated loops of bowel are seen. Electronically Signed   By: Meda Klinefelter MD   On: 07/26/2020 08:24   [place] EKG SITE RITE  Result Date: 07/26/2020 If Site Rite image not attached, placement could not be confirmed due to current cardiac rhythm.   Microbiology: Recent Results (from the past 240 hour(s))  SARS Coronavirus 2 by RT PCR (hospital order, performed in [facility] hospital lab) Nasopharyngeal Nasopharyngeal Swab     Status: None   Collection Time: 07/24/20  7:46 PM   Specimen: Nasopharyngeal Swab  Result Value Ref Range Status   SARS Coronavirus 2 NEGATIVE NEGATIVE Final    Comment: (NOTE) SARS-CoV-2 target nucleic acids are NOT DETECTED.  The SARS-CoV-2 RNA is generally detectable in upper and lower respiratory specimens during the acute phase of infection. The lowest concentration of SARS-CoV-2 viral copies this assay can detect is 250 copies / mL. A negative result does not preclude SARS-CoV-2 infection and should not be used as the sole basis for treatment or other patient management decisions.  A negative result may occur with improper specimen collection / handling, submission of specimen other than nasopharyngeal swab, presence of viral mutation(s) within the areas targeted by this assay, and inadequate number of viral copies (<250 copies / mL). A negative result must be combined with clinical observations, patient history, and epidemiological information.  Fact Sheet for Patients:   BoilerBrush.com.cy  Fact Sheet for Healthcare Providers: https://pope.com/  This test is not yet approved or  cleared by the [place] FDA and has been authorized for detection and/or diagnosis of SARS-CoV-2 by FDA under an Emergency Use  Authorization (EUA).  This EUA will remain in effect (meaning this test can be used) for the duration of the COVID-19 declaration under Section 564(b)(1) of the Act, 21 U.S.C. section 360bbb-3(b)(1), unless the authorization is terminated or revoked sooner.  Performed at [facility], [address] [place], [place] [address]   Blood culture (routine x 2)     Status: None   Collection Time: 07/24/20 10:05 PM   Specimen: BLOOD  Result Value Ref Range Status   Specimen Description BLOOD LEFT ANTECUBITAL  Final   Special Requests   Final    BOTTLES DRAWN AEROBIC AND ANAEROBIC Blood Culture results may not be optimal due to an inadequate volume of blood received in culture bottles   Culture   Final    NO GROWTH 5 DAYS Performed at [facility], [address], [place], [place] [address]    Report Status 07/29/2020 FINAL  Final  Blood culture (routine  x 2)     Status: None   Collection Time: 07/24/20 10:05 PM   Specimen: BLOOD  Result Value Ref Range Status   Specimen Description BLOOD RIGHT ANTECUBITAL  Final   Special Requests   Final    BOTTLES DRAWN AEROBIC AND ANAEROBIC Blood Culture results may not be optimal due to an inadequate volume of blood received in culture bottles   Culture   Final    NO GROWTH 5 DAYS Performed at [facility], [address] [place], [place] [address]    Report Status 07/29/2020 FINAL  Final  MRSA PCR Screening     Status: None   Collection Time: 07/25/20  3:00 AM   Specimen: Nasopharyngeal  Result Value Ref Range Status   MRSA by PCR NEGATIVE NEGATIVE Final    Comment:        The GeneXpert MRSA Assay (FDA approved for NASAL specimens only), is one component of a comprehensive MRSA colonization surveillance program. It is not intended to diagnose MRSA infection nor to guide or monitor treatment for MRSA infections. Performed at [facility], [address] [place], [place] [address]       Labs: Basic Metabolic Panel: Recent Labs  Lab 07/24/20 2205 07/25/20 0456 07/26/20 0547 07/27/20 0609 07/28/20 0643 07/29/20 0454 07/30/20 0343  NA 125*   < > 131* 134* 133* 132* 135  K 3.6   < > 2.9* 3.7 4.4 4.3 4.5  CL 72*   < > 88* 97* 102 102 104  CO2 26   < > 28 28 24  21* 24  GLUCOSE 79   < > 99 108* 112* 86 99  BUN 105*   < > 106* 47* 18 7 12   CREATININE 5.83*   < > 4.07* 1.39* 0.87 0.77 0.92  CALCIUM 7.5*   < > 7.3* 7.8* 7.9* 8.0* 8.1*  MG 2.6*  --  2.6* 2.8* 2.0  --  1.6*  PHOS  --   --   --  2.0* 1.0* 1.9* 2.6   < > = values in this interval not displayed.   Liver Function Tests: Recent Labs  Lab 07/25/20 0456 07/27/20 0609 07/28/20 0643 07/29/20 0454 07/30/20 0343  AST 25 24 25 24 25   ALT 13 14 15 17 20   ALKPHOS 46 36* 38 40 44  BILITOT 1.7* 0.8 0.5 0.4 0.3  PROT 6.7 5.8* 5.6* 5.8* 6.3*  ALBUMIN 3.7 2.7* 2.7* 2.8* 3.0*   No results for input(s): LIPASE, AMYLASE in the last 168 hours. No results for input(s): AMMONIA in the last 168 hours. CBC: Recent Labs  Lab 07/24/20 1945 07/25/20 0456 07/26/20 0547 07/27/20 0609 07/28/20 0643 07/29/20 0454 07/30/20 0343  WBC 11.2* 11.8* 10.2 9.5 11.5* 12.0* 11.4*  NEUTROABS 9.3* 9.9*  --  7.3  --   --   --   HGB 16.9 14.5 12.5* 10.2* 9.8* 9.2* 9.2*  HCT 47.2 39.5 34.3* 29.0* 28.6* 26.1* 27.1*  MCV 89.9 89.4 89.8 94.2 96.0 93.5 94.4  PLT 406* 365 286 267 280 305 365   Cardiac Enzymes: No results for input(s): CKTOTAL, CKMB, CKMBINDEX, TROPONINI in the last 168 hours. BNP: BNP (last 3 results) No results for input(s): BNP in the last 8760 hours.  ProBNP (last 3 results) No results for input(s): PROBNP in the last 8760 hours.  CBG: Recent Labs  Lab 07/28/20 1148 07/28/20 1814 07/28/20 2341 07/29/20 0543 07/29/20 1155  GLUCAP 100* 76 101* 86 117*  Signed:  Silvano Bilis MD.  [facility] 07/30/2020, 9:54 AM

## 2020-07-30 NOTE — Discharge Instructions (Signed)
In addition to included general post-operative instructions for hernia repair with small bowel resection,  Diet: Resume home diet.   Activity: No heavy lifting >20 pounds (children, pets, laundry, garbage) for 6 weeks, but light activity and walking are encouraged. Do not drive or drink alcohol if taking narcotic pain medications or having pain that might distract from driving.  Wound care: You may shower/get incision wet with soapy water and pat dry (do not rub incisions), but no baths or submerging incision underwater until follow-up. Follow up in 1 week for staple removal  Medications: Resume all home medications. For mild to moderate pain: acetaminophen (Tylenol) or ibuprofen/naproxen (if no kidney disease). Combining Tylenol with alcohol can substantially increase your risk of causing liver disease. Narcotic pain medications, if prescribed, can be used for severe pain, though may cause nausea, constipation, and drowsiness. Do not combine Tylenol and Percocet (or similar) within a 6 hour period as Percocet (and similar) contain(s) Tylenol. If you do not need the narcotic pain medication, you do not need to fill the prescription.  Call office [phone] / [phone]) at any time if any questions, worsening pain, fevers/chills, bleeding, drainage from incision site, or other concerns.

## 2020-07-30 NOTE — Progress Notes (Signed)
Graciella Belton Hiebert to be D/C'd back to jail per MD order.  Discussed prescriptions and follow up appointments with the patient. Prescriptions given to patient, medication list explained in detail. Pt verbalized understanding.  Allergies as of 07/30/2020       Reactions   Bupropion Other (See Comments)   Seizure   Desipramine Other (See Comments)   Drunk feeling        Medication List     TAKE these medications    acetaminophen 500 MG tablet Commonly known as: TYLENOL Take 1 tablet (500 mg total) by mouth every 6 (six) hours as needed for mild pain or moderate pain.   amoxicillin-clavulanate 875-125 MG tablet Commonly known as: Augmentin Take 1 tablet by mouth 2 (two) times daily for 14 days.   buprenorphine-naloxone 8-2 mg Subl SL tablet Commonly known as: SUBOXONE Place 1 tablet under the tongue 2 (two) times daily.   ondansetron 4 MG tablet Commonly known as: Zofran Take 1 tablet (4 mg total) by mouth every 8 (eight) hours as needed.   oxyCODONE 5 MG immediate release tablet Commonly known as: Roxicodone Take 1 tablet (5 mg total) by mouth every 6 (six) hours as needed for breakthrough pain.   pantoprazole 40 MG tablet Commonly known as: PROTONIX Take 1 tablet (40 mg total) by mouth daily.   QUEtiapine 100 MG tablet Commonly known as: SEROQUEL Take 200 mg by mouth at bedtime.   rizatriptan 5 MG tablet Commonly known as: MAXALT Take 5 mg by mouth as needed for migraine. May repeat in 2 hours if needed   tamsulosin 0.4 MG Caps capsule Commonly known as: FLOMAX Take 0.8 mg by mouth daily.               Discharge Care Instructions  (From admission, onward)           Start     Ordered   07/30/20 0000  Discharge wound care:       Comments: Keep clean, dry   07/30/20 0953            Vitals:   07/30/20 1110 07/30/20 1140  BP:    Pulse: (!) 104 98  Resp:    Temp:    SpO2: 95%     Skin clean, dry and intact without evidence of skin break  down, no evidence of skin tears noted. IV catheter discontinued intact. Site without signs and symptoms of complications. Dressing and pressure applied. Pt denies pain at this time. No complaints noted.  An After Visit Summary was printed and given to the patient. Patient escorted via WC, and D/C home via officer transport.  Kaion Tisdale A Kadance Mccuistion

## 2020-07-30 NOTE — Progress Notes (Signed)
[facility] SURGICAL PROGRESS NOTE  Hospital Day(s): 6.   Post op day(s): 6 Days Post-Op.   Interval History:  Patient seen and examined No acute events or new complaints overnight.  Patient with some delirium earlier in the morning but much more alert and awake now Still with incisional soreness, improving No fever, chills, nausea, emesis Mild, but stable, leukocytosis to 11.4K  Renal function is normal, sCr - 0.92, UO - 1.3L Mild hypomagnesemia to 1.6 but otherwise no electrolyte derangements Worked with PT over the weekend, no recommendations for home Tolerating regular diet; + bowel function   Vital signs in last 24 hours: [min-max] current  Temp:  [97.6 F (36.4 C)-99.4 F (37.4 C)] 98.2 F (36.8 C) (02/07 0350) Pulse Rate:  [80-110] 110 (02/07 0350) Resp:  [16-18] 16 (02/07 0350) BP: (92-111)/(61-68) 104/66 (02/07 0350) SpO2:  [95 %-98 %] 98 % (02/07 0350)     Height: 6' (182.9 cm) Weight: 69.7 kg BMI (Calculated): 20.84   Intake/Output last 2 shifts:  02/06 0701 - 02/07 0700 In: 696.9 [P.O.:240; I.V.:217.7; IV Piggyback:239.2] Out: 1325 [Urine:1325]   Physical Exam:  Constitutional: alert, cooperative and no distress Respiratory: breathing non-labored at rest  Cardiovascular: regular rate and sinus rhythm  Gastrointestinal:Soft,incisional soreness improving, non-distended, no rebound/guarding. He does have a reducible and soft ventral hernia present. Integumentary:Left mid abdomen incision is CDI with staples, no erythema  Labs:  CBC Latest Ref Rng & Units 07/30/2020 07/29/2020 07/28/2020  WBC 4.0 - 10.5 K/uL 11.4(H) 12.0(H) 11.5(H)  Hemoglobin 13.0 - 17.0 g/dL 9.3(O) 6.7(T) 2.4(P)  Hematocrit 39.0 - 52.0 % 27.1(L) 26.1(L) 28.6(L)  Platelets 150 - 400 K/uL 365 305 280   CMP Latest Ref Rng & Units 07/30/2020 07/29/2020 07/28/2020  Glucose 70 - 99 mg/dL 99 86 809(X)  BUN 6 - 20 mg/dL 12 7 18   Creatinine 0.61 - 1.24 mg/dL 8.33 8.25  Sodium 135 -  145 mmol/L 135 132(L) 133(L)  Potassium 3.5 - 5.1 mmol/L 4.5 4.3 4.4  Chloride 98 - 111 mmol/L 104 102 102  CO2 22 - 32 mmol/L 24 21(L) 24  Calcium 8.9 - 10.3 mg/dL 8.1(L) 8.0(L) 7.9(L)  Total Protein 6.5 - 8.1 g/dL 6.3(L) 5.8(L) 5.6(L)  Total Bilirubin 0.3 - 1.2 mg/dL 0.3 0.4 0.5  Alkaline Phos 38 - 126 U/L 44 40 38  AST 15 - 41 U/L 25 24 25   ALT 0 - 44 U/L 20 17 15      Imaging studies: No new pertinent imaging studies   Assessment/Plan:  55 y.o. male 6 Days Post-Op s/p exploratory laparotomy and small bowel resectionfor strangulated incisional hernia, complicated by, now improving,AKI secondary to likely septic shock   - Okay to continue regular diet  - Continue IV ABx (Zosyn); recommend additional 7 days Augmentin for discharge - Monitor abdominal examination; on-going bowel function   - Pain control prn; antiemetics prn - Mobilization as tolerated; PT seen   - Further management per primary service    - Discharge Planning; Patient stable for discharge from surgical standpoint, ABX as above, follow in 1 week for staple removal, reviewed post-op restrictions   All of the above findings and recommendations were discussed with the patient, and the medical team, and all of patient's questions were answered to his expressed satisfaction.  -- , PA-C [facility] 07/30/2020, 7:11 AM [phone] M-F: 7am - 4pm

## 2020-08-10 ENCOUNTER — Encounter: Payer: Self-pay | Admitting: Physician Assistant

## 2020-08-16 ENCOUNTER — Ambulatory Visit (INDEPENDENT_AMBULATORY_CARE_PROVIDER_SITE_OTHER): Payer: Medicare Other | Admitting: Physician Assistant

## 2020-08-16 ENCOUNTER — Other Ambulatory Visit: Payer: Self-pay

## 2020-08-16 ENCOUNTER — Encounter: Payer: Self-pay | Admitting: Physician Assistant

## 2020-08-16 VITALS — BP 109/73 | HR 71 | Temp 98.6°F | Ht 72.0 in | Wt 156.0 lb

## 2020-08-16 DIAGNOSIS — Z09 Encounter for follow-up examination after completed treatment for conditions other than malignant neoplasm: Secondary | ICD-10-CM

## 2020-08-16 NOTE — Progress Notes (Signed)
[facility] POST-OP OFFICE VISIT  08/16/2020  HPI: Alan Nelson is a 55 y.o. male 22 days s/p exploratory laparotomy and small bowel resection for strangulated incisional hernia with Dr Everlene Farrier.   He is overall doing well He does have some incisional soreness No fever, chills, nausea, emesis He does believe "his kidneys" are hurting, and reports "it takes him about 10 minutes to intitiate urination." He is on tamsulosin.   Vital signs: BP 109/73   Pulse 71   Temp 98.6 F (37 C) (Oral)   Ht 6' (1.829 m)   Wt 156 lb (70.8 kg)   SpO2 99%   BMI 21.16 kg/m    Physical Exam: Constitutional: Well appearing male, in handcuffs, prison guards at bedside Abdomen: Soft, non-tender, non-distended, no rebound/guarding. He does have a reducible umbilical hernia Skin: Incision is CDI with staples (removed), no erythema or drainage   Assessment/Plan: This is a 55 y.o. male exploratory laparotomy and small bowel resection for strangulated incisional hernia   - Pain control prn  - Reviewed lifting restrictions  - Reviewed wound care; staples removed; replaced with steri-strips  - He can follow up prn   -- Lynden Oxford, PA-C [facility] 08/16/2020, 10:40 AM [phone] M-F: 7am - 4pm

## 2020-08-16 NOTE — Patient Instructions (Signed)
We removed the staples from the wound today. We placed steri strips. These will begin to curl up on the ends and fall off within 10-12 days.  You may shower as usual. You may let soapy water run over the area but be sure to pat the strips dry.  No heavy lifting for 3 more weeks.  You may need to follow up with a Urologist for the urination issues that you are currently having.

## 2021-08-04 ENCOUNTER — Emergency Department

## 2021-08-04 ENCOUNTER — Encounter: Payer: Self-pay | Admitting: Emergency Medicine

## 2021-08-04 ENCOUNTER — Other Ambulatory Visit: Payer: Self-pay

## 2021-08-04 ENCOUNTER — Emergency Department
Admission: EM | Admit: 2021-08-04 | Discharge: 2021-08-04 | Disposition: A | Discharge status: Home / Self Care | Attending: Emergency Medicine | Admitting: Emergency Medicine

## 2021-08-04 DIAGNOSIS — Y92149 Unspecified place in prison as the place of occurrence of the external cause: Secondary | ICD-10-CM | POA: Diagnosis not present

## 2021-08-04 DIAGNOSIS — M79671 Pain in right foot: Secondary | ICD-10-CM | POA: Diagnosis not present

## 2021-08-04 DIAGNOSIS — W010XXA Fall on same level from slipping, tripping and stumbling without subsequent striking against object, initial encounter: Secondary | ICD-10-CM | POA: Insufficient documentation

## 2021-08-04 DIAGNOSIS — M79672 Pain in left foot: Secondary | ICD-10-CM | POA: Diagnosis not present

## 2021-08-04 DIAGNOSIS — S42201A Unspecified fracture of upper end of right humerus, initial encounter for closed fracture: Secondary | ICD-10-CM | POA: Diagnosis not present

## 2021-08-04 DIAGNOSIS — W19XXXA Unspecified fall, initial encounter: Secondary | ICD-10-CM

## 2021-08-04 DIAGNOSIS — S4991XA Unspecified injury of right shoulder and upper arm, initial encounter: Secondary | ICD-10-CM | POA: Diagnosis present

## 2021-08-04 MED ORDER — TRAMADOL HCL 50 MG PO TABS
50.0000 mg | ORAL_TABLET | Freq: Once | ORAL | Status: AC
  Administered 2021-08-04: 50 mg via ORAL
  Filled 2021-08-04: qty 1

## 2021-08-04 MED ORDER — ACETAMINOPHEN 500 MG PO TABS
1000.0000 mg | ORAL_TABLET | Freq: Once | ORAL | Status: AC
  Administered 2021-08-04: 1000 mg via ORAL
  Filled 2021-08-04: qty 2

## 2021-08-04 MED ORDER — TRAMADOL HCL 50 MG PO TABS: 50.0000 mg | ORAL_TABLET | Freq: Four times a day (QID) | ORAL | 0 refills | Status: AC | PRN

## 2021-08-04 MED ORDER — ACETAMINOPHEN 500 MG PO TABS: 1000.0000 mg | ORAL_TABLET | Freq: Three times a day (TID) | ORAL | 0 refills | Status: AC | PRN

## 2021-08-04 NOTE — Discharge Instructions (Signed)
Patient needs follow up with orthopedist for repeat XR in 1 week. For pain, tylenol 1000 mg every 8 hours and tramadol 50 mg every 4 hours as needed for severe pain. Keep arm on sling until cleared by orthopedist

## 2021-08-04 NOTE — ED Provider Notes (Signed)
[facility] Provider Note    Event Date/Time   First MD Initiated Contact with Patient 08/04/21 0045     (approximate)   History   Fall   HPI  Alan Nelson is a 56 y.o. male who presents from jail after sustaining a mechanical fall.  The patient reports that he was reaching from the top bunk bed onto a desk when he lost his balance and fell.  He is complaining of right-sided shoulder pain and bilateral foot pain.  He denies head trauma or LOC.  No neck pain or back pain.  Is not on any blood thinners.  His worst pain is on his right shoulder, reports the pain is worse with any movement, moderate in intensity.     History reviewed. No pertinent past medical history.  Past Surgical History:  Procedure Laterality Date   APPENDECTOMY     LAPAROTOMY N/A 07/24/2020   Procedure: EXPLORATORY LAPAROTOMY TO FIX VENTRAL HERNIA;  Surgeon: Leafy Ro, MD;  Location: [facility] ORS;  Service: General;  Laterality: N/A;   splennectomy       Physical Exam   Triage Vital Signs: ED Triage Vitals  Enc Vitals Group     BP 08/04/21 0039 (!) 130/95     Pulse Rate 08/04/21 0039 85     Resp 08/04/21 0039 16     Temp 08/04/21 0039 98.7 F (37.1 C)     Temp Source 08/04/21 0039 Oral     SpO2 08/04/21 0039 96 %     Weight 08/04/21 0035 200 lb (90.7 kg)     Height 08/04/21 0035 6' (1.829 m)     Head Circumference --      Peak Flow --      Pain Score 08/04/21 0035 10     Pain Loc --      Pain Edu? --      Excl. in GC? --     Most recent vital signs: Vitals:   08/04/21 0039  BP: (!) 130/95  Pulse: 85  Resp: 16  Temp: 98.7 F (37.1 C)  SpO2: 96%    Full spinal precautions maintained throughout the trauma exam. Constitutional: Alert and oriented. No acute distress. Does not appear intoxicated. HEENT Head: Normocephalic and atraumatic. Face: No facial bony tenderness. Stable midface Ears: No hemotympanum bilaterally. No Battle sign Eyes: No eye injury.  PERRL. No raccoon eyes Nose: Nontender. No epistaxis. No rhinorrhea Mouth/Throat: Mucous membranes are moist. No oropharyngeal blood. No dental injury. Airway patent without stridor. Normal voice. Neck: no C-collar. No midline c-spine tenderness.  Cardiovascular: Normal rate, regular rhythm. Normal and symmetric distal pulses are present in all extremities. Pulmonary/Chest: Chest wall is stable and nontender to palpation/compression. Normal respiratory effort. Breath sounds are normal. No crepitus.  Abdominal: Soft, nontender, non distended. Musculoskeletal: Tender to palpation on the proximal humerus on the right with no deformity.  Nontender with normal full range of motion in all other extremities. No deformities. No thoracic or lumbar midline spinal tenderness. Pelvis is stable. Skin: Skin is warm, dry and intact. No abrasions or contutions. Psychiatric: Speech and behavior are appropriate. Neurological: Normal speech and language. Moves all extremities to command. No gross focal neurologic deficits are appreciated.   ED Results / Procedures / Treatments   Labs (all labs ordered are listed, but only abnormal results are displayed) Labs Reviewed - No data to display   EKG  none   RADIOLOGY I, Nita Sickle, attending MD, have personally  viewed and interpreted the images obtained during this visit as below:  X-ray with a questionable nondisplaced humeral neck fracture, x-ray of bilateral feet are negative   ___________________________________________________ Interpretation by Radiologist:  DG Shoulder Right  Result Date: 08/04/2021 CLINICAL DATA:  Fall and right shoulder pain. EXAM: RIGHT SHOULDER - 2+ VIEW COMPARISON:  None. FINDINGS: Evaluation is limited in the absence of axial view. There is apparent cortical angulation of the humeral neck which may be projectional or represent a nondisplaced fracture. Clinical correlation recommended. The bones are osteopenic. No  dislocation. There is mild arthritic changes of the right shoulder. The soft tissues are unremarkable. IMPRESSION: Projectional artifact versus nondisplaced humeral neck fracture. Electronically Signed   By: Elgie Collard M.D.   On: 08/04/2021 01:01   DG Foot Complete Left  Result Date: 08/04/2021 CLINICAL DATA:  Fall, pain EXAM: LEFT FOOT - COMPLETE 3+ VIEW COMPARISON:  None. FINDINGS: No fracture or dislocation is seen. The joint spaces are preserved. Visualized soft tissues are within normal limits. IMPRESSION: Negative. Electronically Signed   By: Charline Bills M.D.   On: 08/04/2021 01:01   DG Foot Complete Right  Result Date: 08/04/2021 CLINICAL DATA:  Fall, pain EXAM: RIGHT FOOT COMPLETE - 3+ VIEW COMPARISON:  None. FINDINGS: No fracture or dislocation is seen. The joint spaces are preserved. Visualized soft tissues are within normal limits. IMPRESSION: Negative. Electronically Signed   By: Charline Bills M.D.   On: 08/04/2021 01:00      PROCEDURES:  Critical Care performed: No  Procedures    IMPRESSION / MDM / ASSESSMENT AND PLAN / ED COURSE  I reviewed the triage vital signs and the nursing notes.   56 y.o. male who presents from jail after sustaining a mechanical fall.  Patient complaining of right shoulder pain and bilateral feet pain  Ddx: Fracture, dislocation, contusion, sprain   Plan: X-ray of the right shoulder and bilateral feet   MEDICATIONS GIVEN IN ED: Medications  acetaminophen (TYLENOL) tablet 1,000 mg (1,000 mg Oral Given 08/04/21 0131)  traMADol (ULTRAM) tablet 50 mg (50 mg Oral Given 08/04/21 0131)     ED COURSE: X-ray visualized by me consistent with a nondisplaced humeral neck fracture.  X-ray of bilateral feet is negative.  Patient was put on a sling.  Recommended follow-up with orthopedics in a week.  Discussed pain control.  No indications for admission.   Consults: None   EMR reviewed including patient's last admission to the hospital  a year ago for acute renal failure     FINAL CLINICAL IMPRESSION(S) / ED DIAGNOSES   Final diagnoses:  Fall, initial encounter  Closed fracture of proximal end of right humerus, unspecified fracture morphology, initial encounter     Rx / DC Orders   ED Discharge Orders          Ordered    acetaminophen (TYLENOL) 500 MG tablet  Every 8 hours PRN        08/04/21 0149    traMADol (ULTRAM) 50 MG tablet  Every 6 hours PRN        08/04/21 0149             Note:  This document was prepared using Dragon voice recognition software and may include unintentional dictation errors.   Please note:  Patient was evaluated in Emergency Department today for the symptoms described in the history of present illness. Patient was evaluated in the context of the global COVID-19 pandemic, which necessitated consideration that the patient might  be at risk for infection with the SARS-CoV-2 virus that causes COVID-19. Institutional protocols and algorithms that pertain to the evaluation of patients at risk for COVID-19 are in a state of rapid change based on information released by regulatory bodies including the CDC and federal and state organizations. These policies and algorithms were followed during the patient's care in the ED.  Some ED evaluations and interventions may be delayed as a result of limited staffing during the pandemic.       Don Perking, [place], MD 08/04/21 [phone]

## 2021-08-04 NOTE — ED Triage Notes (Signed)
Pt to ED BIB sheriff from jail for medical clearance.  States was getting out of top bunk and slipped and fell.  Pain to right shoulder, right foot and left foot.  Pt ambulatory with steady gait, no obvious deformity noted.

## 2021-08-04 NOTE — ED Notes (Signed)
NAD noted at time of D/C, pt discharged into custody of [facility].

## 2021-08-19 ENCOUNTER — Other Ambulatory Visit: Payer: Self-pay | Admitting: Orthopedic Surgery

## 2021-08-19 DIAGNOSIS — S42294A Other nondisplaced fracture of upper end of right humerus, initial encounter for closed fracture: Secondary | ICD-10-CM

## 2021-08-19 DIAGNOSIS — R29898 Other symptoms and signs involving the musculoskeletal system: Secondary | ICD-10-CM

## 2021-09-02 ENCOUNTER — Ambulatory Visit
Admission: RE | Admit: 2021-09-02 | Discharge: 2021-09-02 | Disposition: A | Discharge status: Home / Self Care | Source: Ambulatory Visit | Attending: Orthopedic Surgery | Admitting: Orthopedic Surgery

## 2021-09-02 ENCOUNTER — Other Ambulatory Visit: Payer: Self-pay

## 2021-09-02 DIAGNOSIS — S42294A Other nondisplaced fracture of upper end of right humerus, initial encounter for closed fracture: Secondary | ICD-10-CM | POA: Insufficient documentation

## 2021-09-02 DIAGNOSIS — R29898 Other symptoms and signs involving the musculoskeletal system: Secondary | ICD-10-CM | POA: Diagnosis present

## 2022-04-01 IMAGING — CT CT HEAD W/O CM
3 series · 15 of 47 positions shown, 18 images · non-contrast
Comparison: Head CT 11/13/2019

CLINICAL DATA: Seizure, nontraumatic (Age >= 41y)

EXAM:
CT HEAD WITHOUT CONTRAST
TECHNIQUE: Contiguous axial images were obtained from the base of the skull
through the vertex without intravenous contrast.

[Series 3: head wo · axial · 0.43mm/px · z∈[-159,-4]mm · 9 of 37 slices shown, 12 images]
[im 3/37  brain]
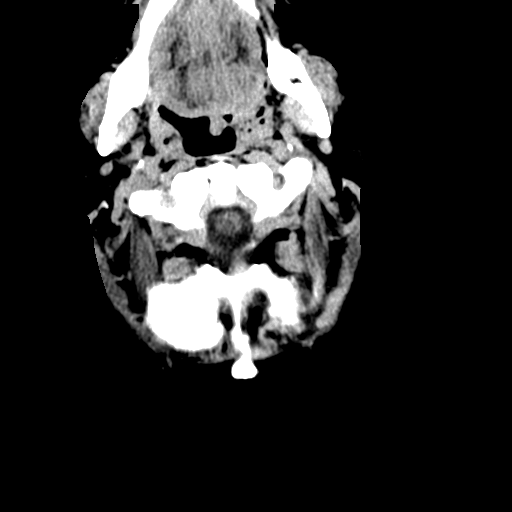
[im 3/37  bone]
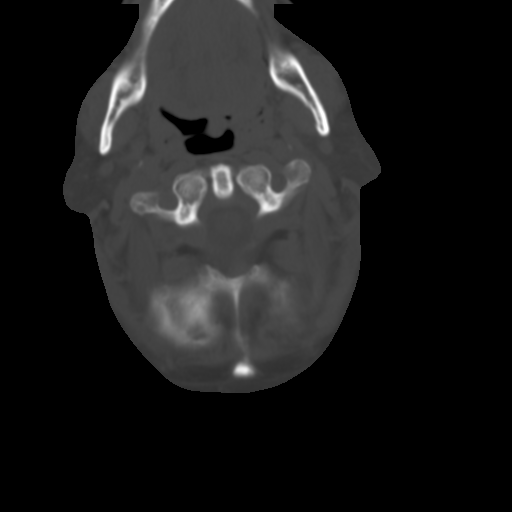
[im 7/37  brain]
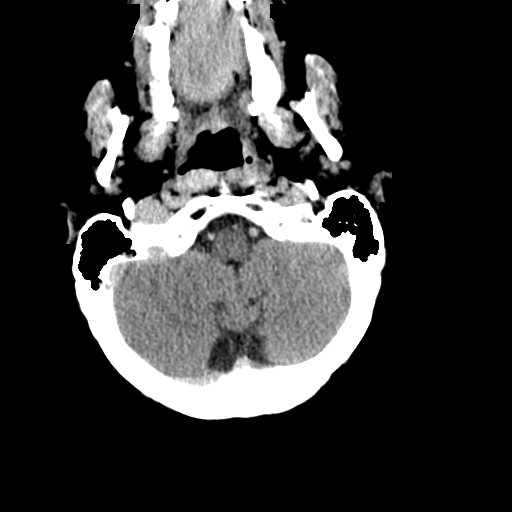
[im 10/37  brain]
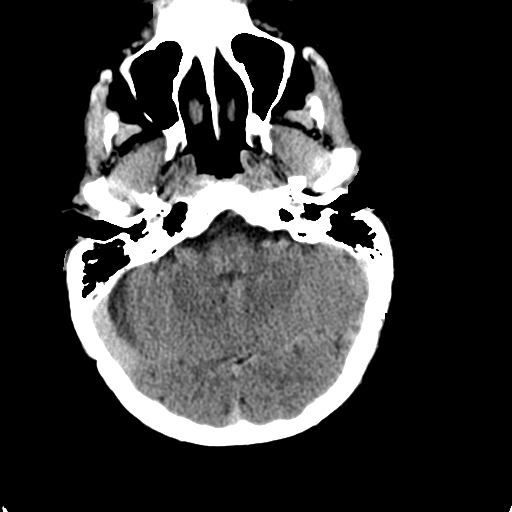
[im 14/37  brain]
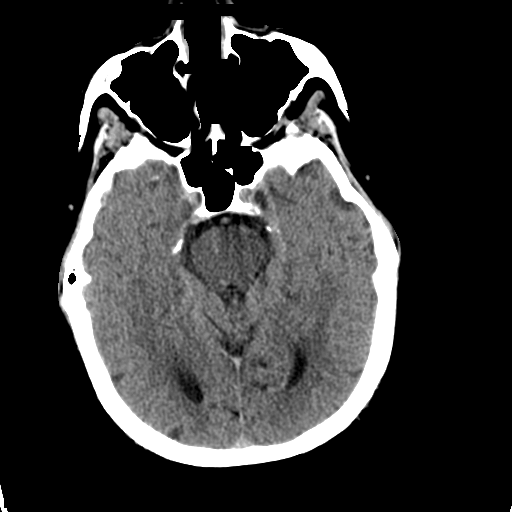
[im 19/37  brain]
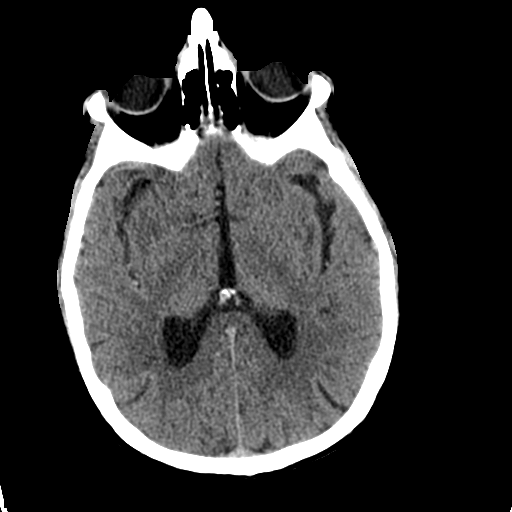
[im 19/37  bone]
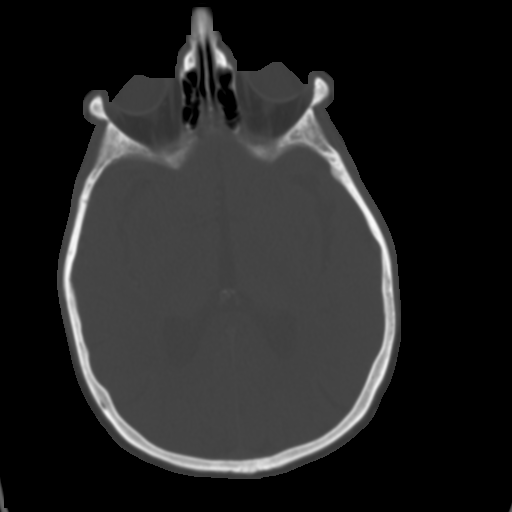
[im 23/37  brain]
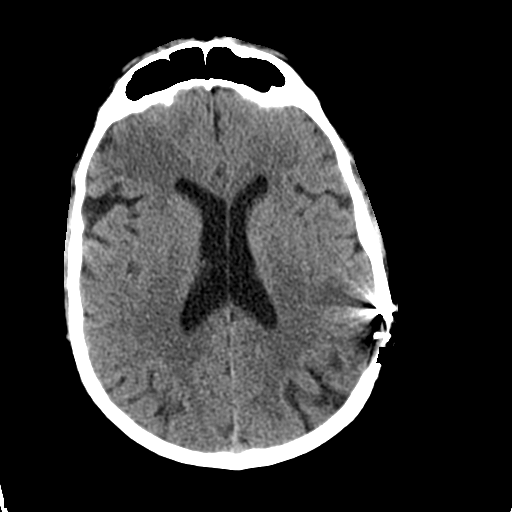
[im 27/37  brain]
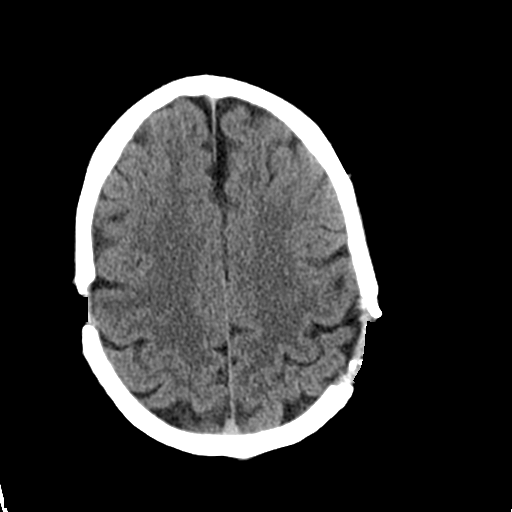
[im 30/37  brain]
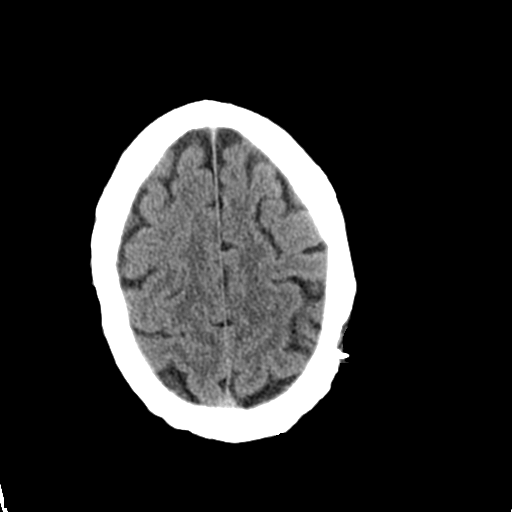
[im 34/37  brain]
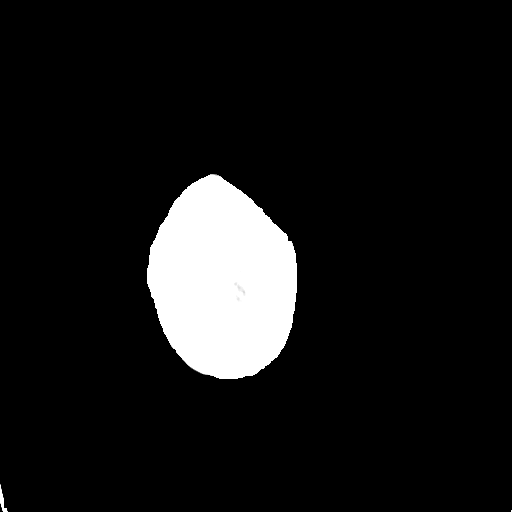
[im 34/37  bone]
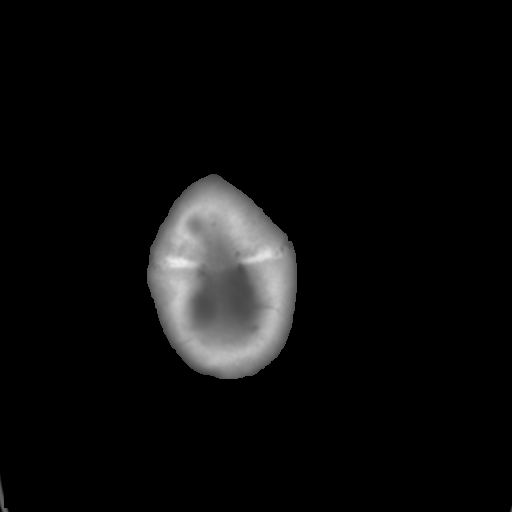

[Series 4: coronal soft tissue · coronal · 0.33mm/px · 3 of 78 slices shown]
[im 26/78  brain]
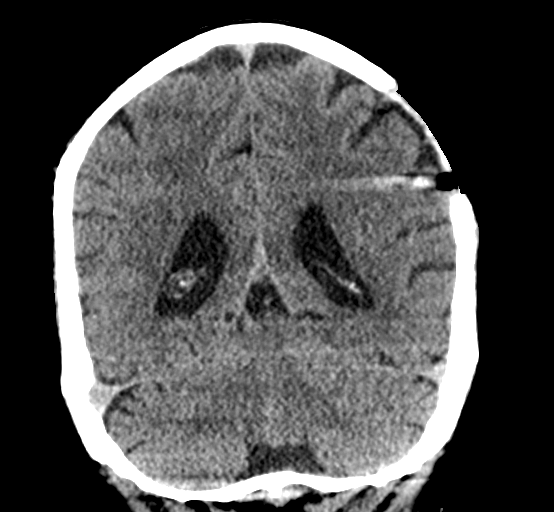
[im 35/78  brain]
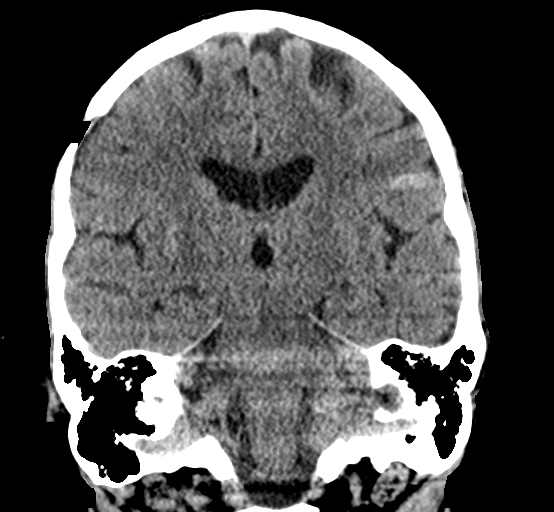
[im 43/78  brain]
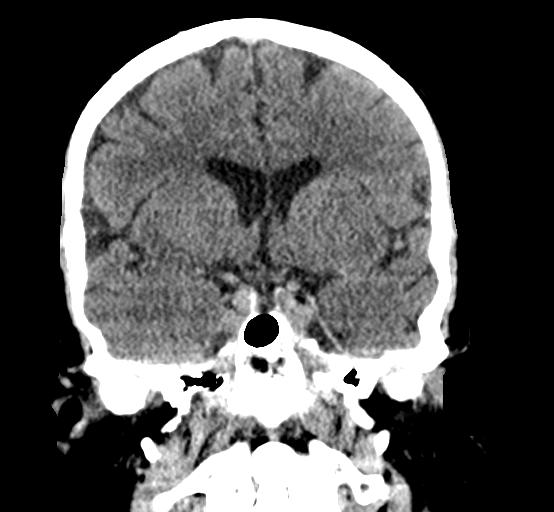

[Series 5: sagittal soft tissue · sagittal · 0.36mm/px · 3 of 61 slices shown]
[im 21/61  brain]
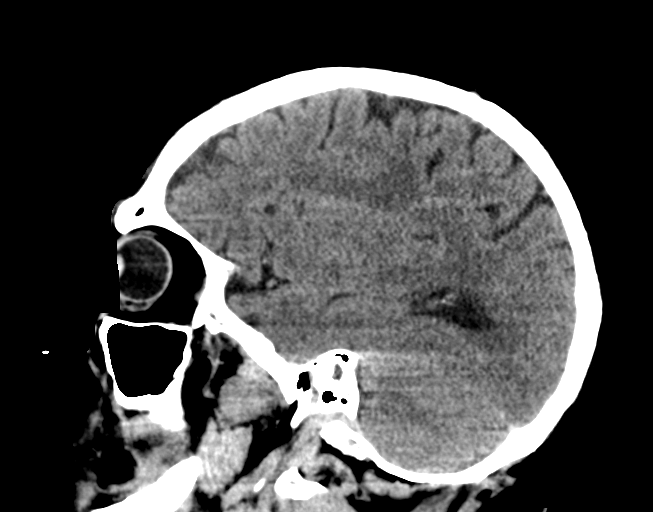
[im 31/61  brain]
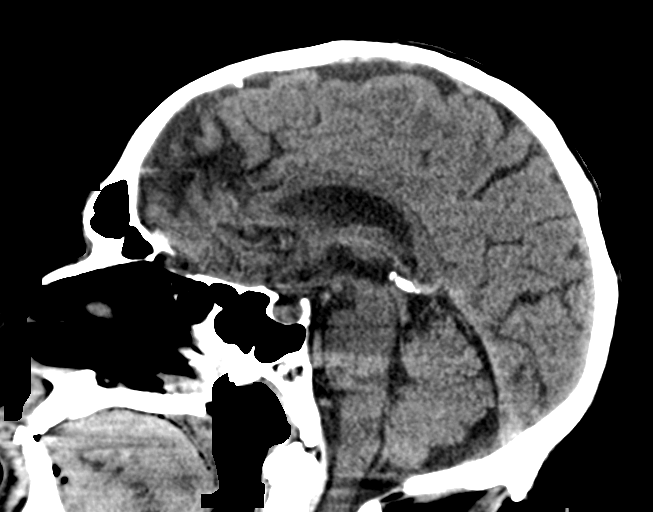
[im 41/61  brain]
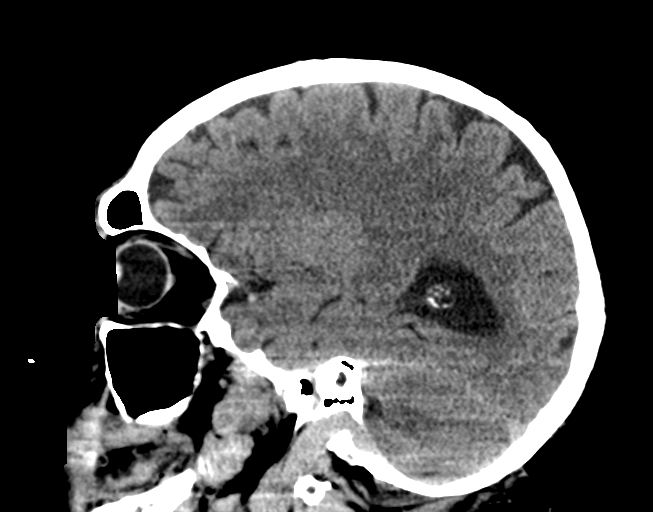

[15 of 47 positions shown; findings below may reference images not displayed]

FINDINGS: Brain: Brain volume is normal for age. No intracranial hemorrhage,
mass effect, or midline shift. No hydrocephalus. Incidental mega
cisterna magna. The basilar cisterns are patent. No evidence of
territorial infarct or acute ischemia. No extra-axial or
intracranial fluid collection.

Vascular: Atherosclerosis of skullbase vasculature without
hyperdense vessel or abnormal calcification.

Skull: Bilateral parietal craniotomy.  No fracture or focal lesion.

Sinuses/Orbits: Paranasal sinuses and mastoid air cells are clear.
The visualized orbits are unremarkable. Multiple dental caries are
partially included.

Other: None.
IMPRESSION: No acute intracranial abnormality or explanation for seizure.

## 2022-04-03 IMAGING — CR DG ABDOMEN ACUTE W/ 1V CHEST
1 series · 3 of 3 positions shown · non-contrast
Comparison: July 25, 2019

CLINICAL DATA: Recent surgery

EXAM:
DG ABDOMEN ACUTE WITH 1 VIEW CHEST

[Series 1: view not recorded · 0.14mm/px · 3 of 3 slices shown]
[im 1/3]
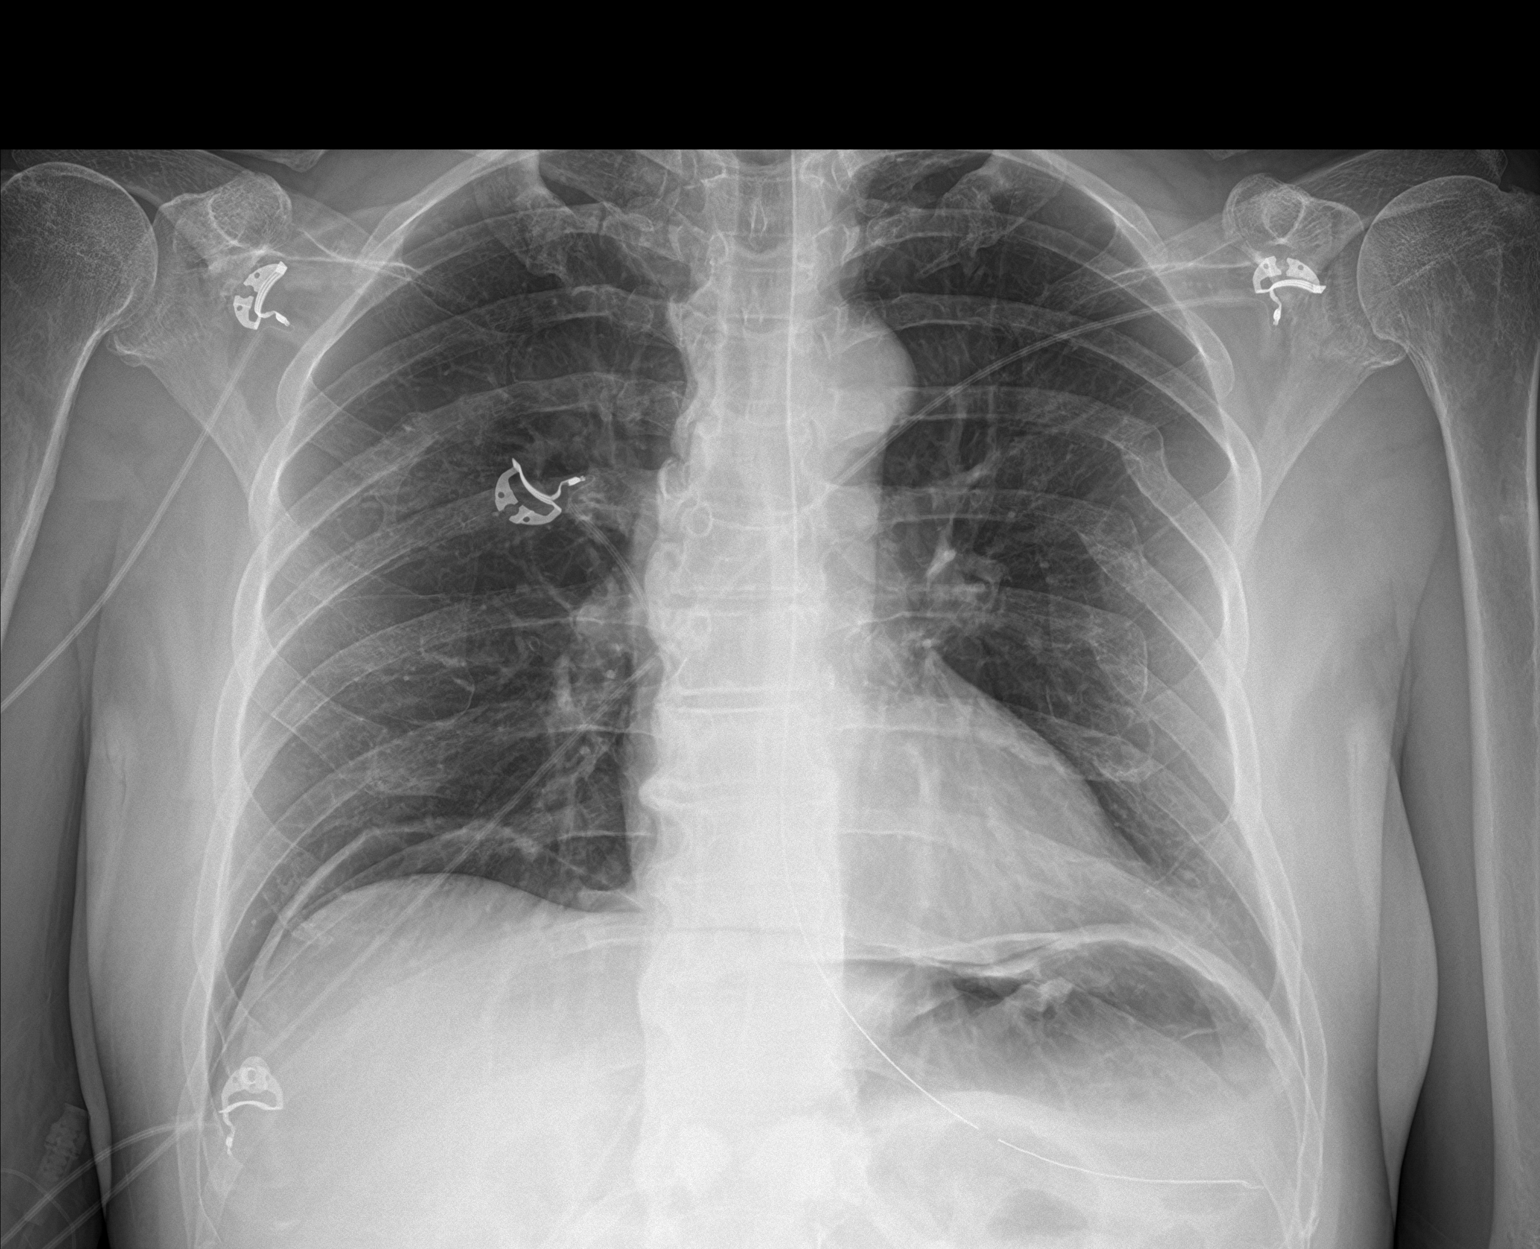
[im 2/3]
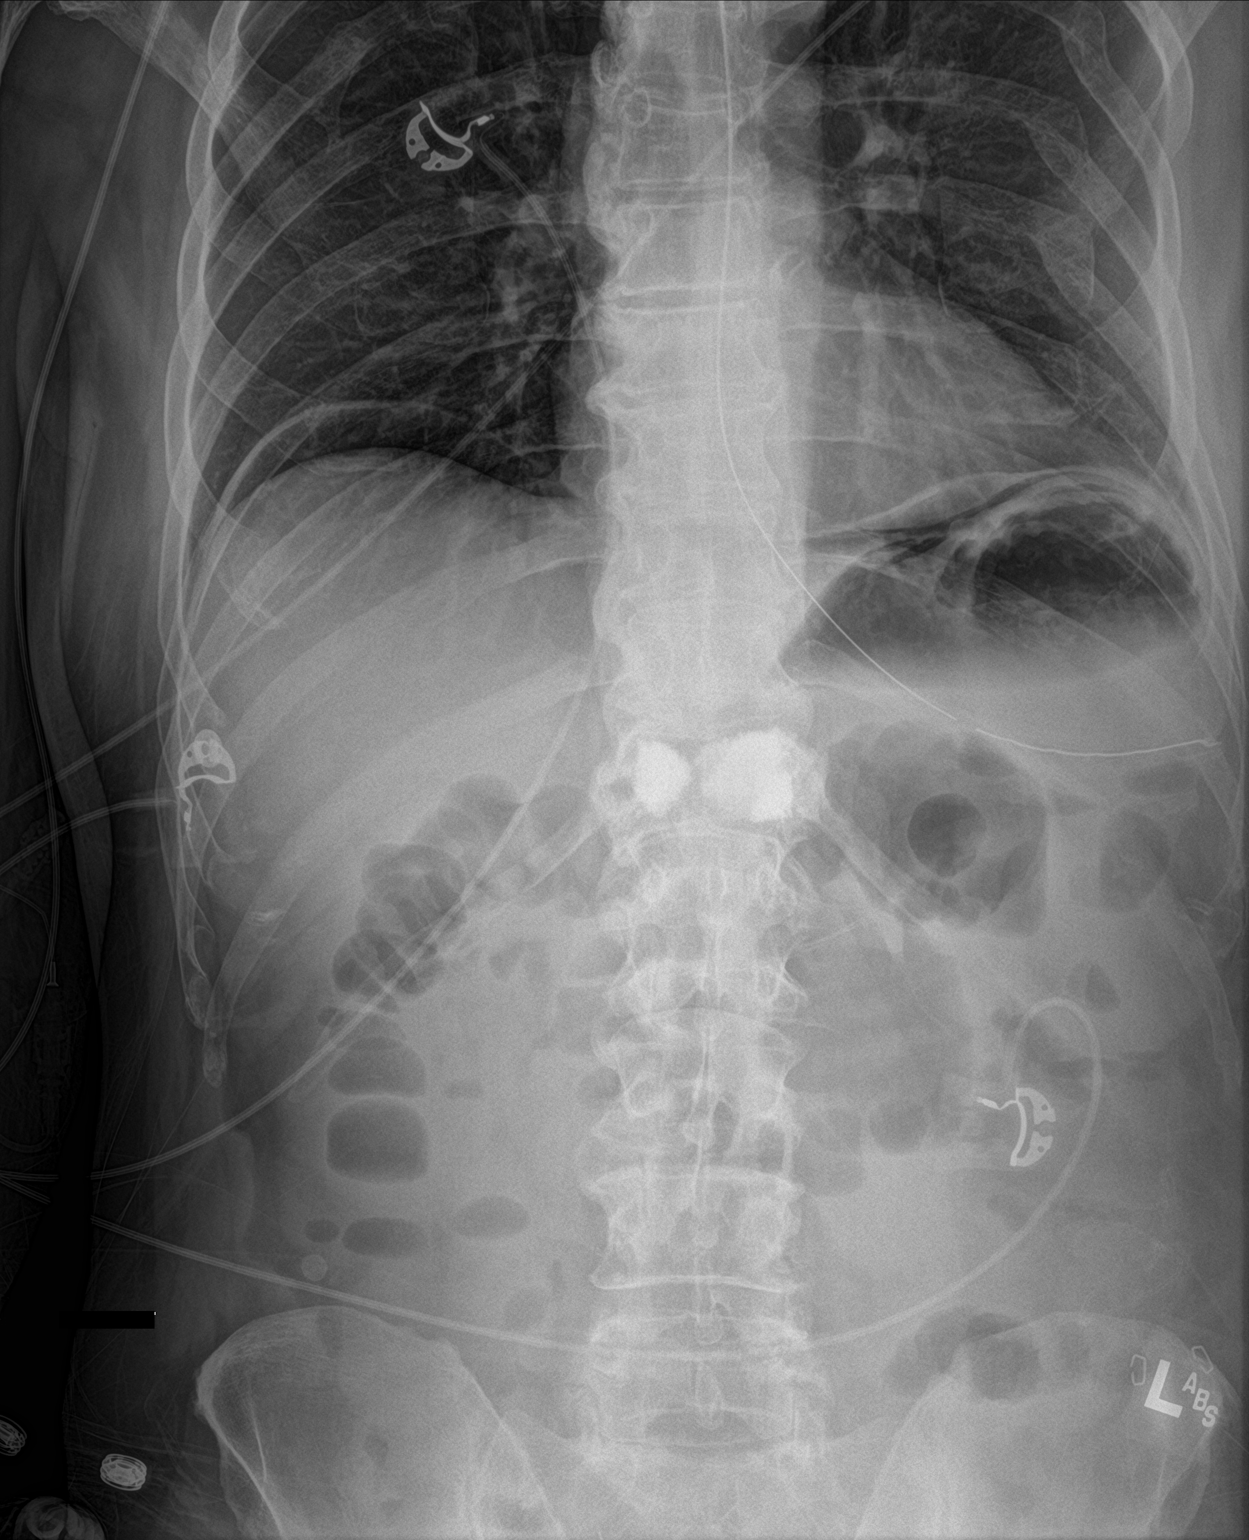
[im 3/3]
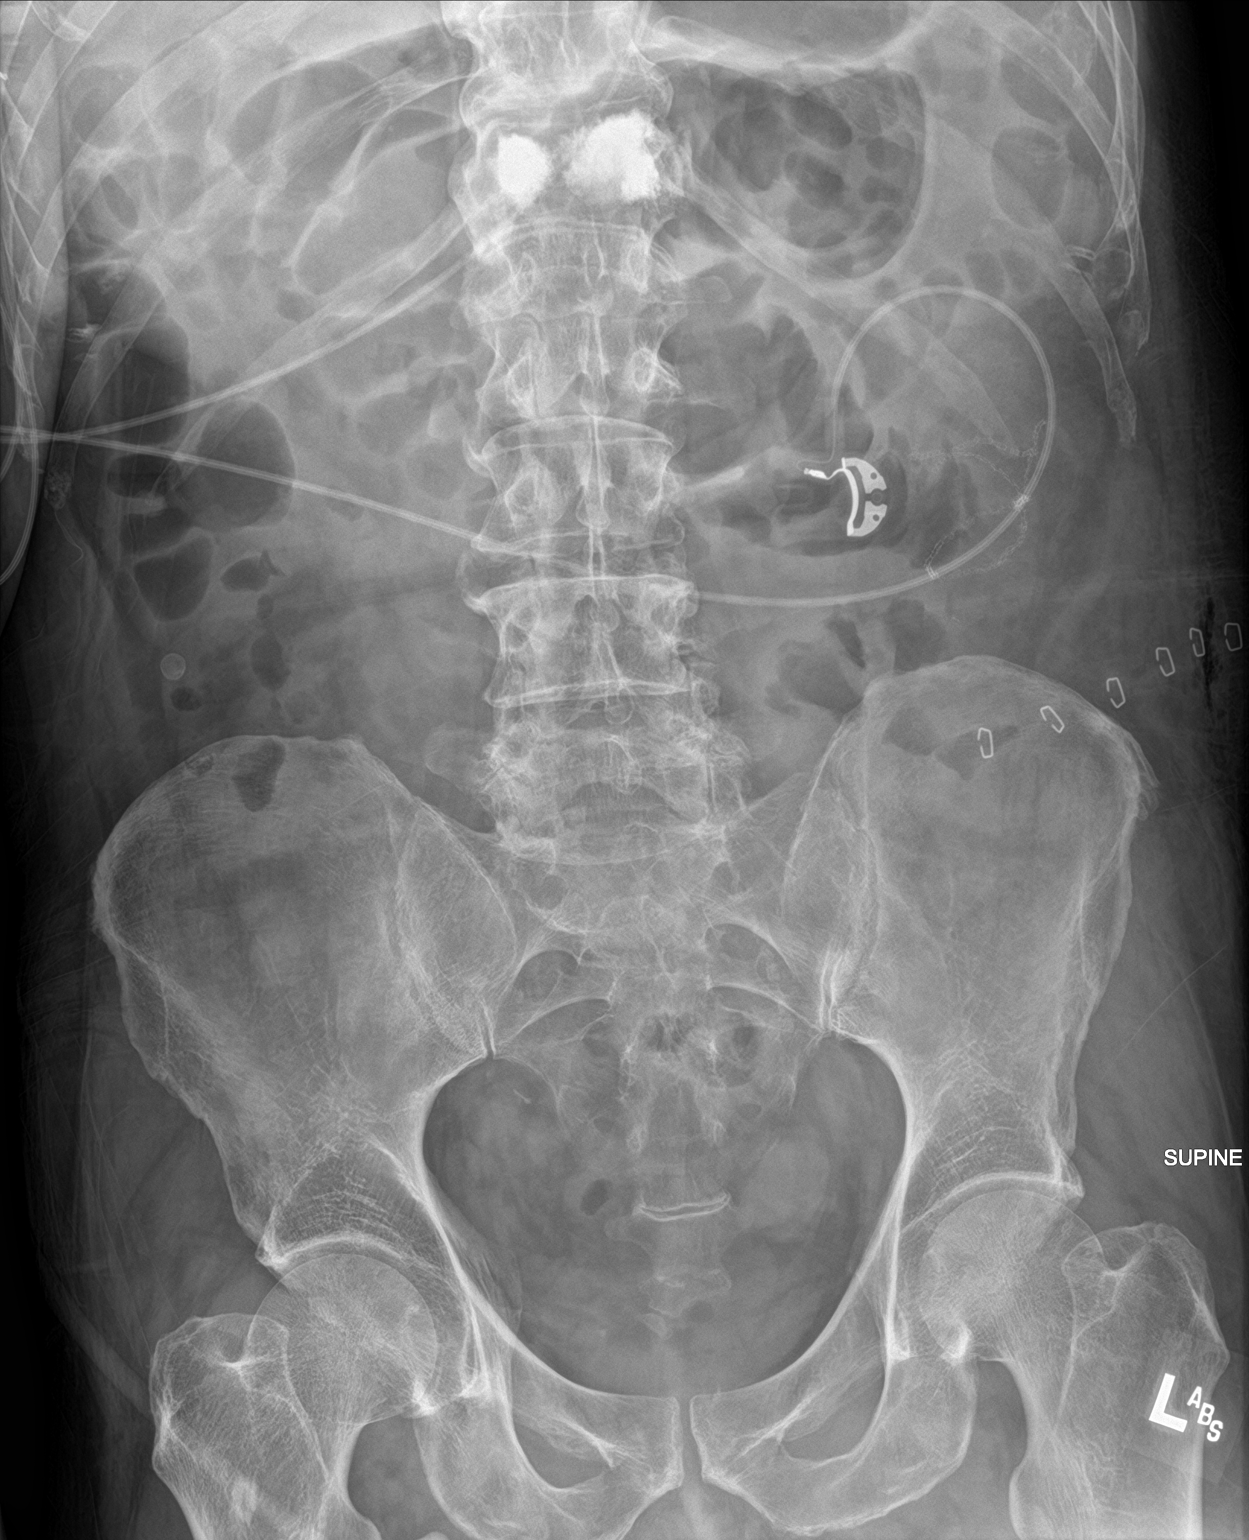

[3 of 3 positions shown; findings below may reference images not displayed]

FINDINGS: Cardiomediastinal silhouette is normal in contour. Scattered
bibasilar atelectasis. No pleural effusion or pneumothorax. Enteric
tube tip and side port project over the stomach.

There is pneumoperitoneum, consistent with recent laparotomy. Soft
tissue air. Surgical staples. Surgical sutures project over the mid
abdomen.

Air filled nondilated loops of bowel. Remote bilateral rib
fractures. Status post vertebral augmentation of T12.
IMPRESSION: 1. Enteric tube tip and side port project over the stomach.
2. Pneumoperitoneum, consistent with recent laparotomy. No dilated
loops of bowel are seen.

## 2024-01-11 ENCOUNTER — Other Ambulatory Visit: Payer: Self-pay | Admitting: Surgery

## 2024-01-11 DIAGNOSIS — K432 Incisional hernia without obstruction or gangrene: Secondary | ICD-10-CM

## 2024-01-13 ENCOUNTER — Inpatient Hospital Stay
Admission: RE | Admit: 2024-01-13 | Discharge: 2024-01-13 | Disposition: A | Discharge status: Home / Self Care | Source: Ambulatory Visit | Attending: Surgery | Admitting: Surgery

## 2024-01-13 DIAGNOSIS — K432 Incisional hernia without obstruction or gangrene: Secondary | ICD-10-CM

## 2024-01-13 MED ORDER — IOPAMIDOL (ISOVUE-300) INJECTION 61%
100.0000 mL | Freq: Once | INTRAVENOUS | Status: AC | PRN
  Administered 2024-01-13: 100 mL via INTRAVENOUS

## 2024-01-18 ENCOUNTER — Ambulatory Visit: Payer: Self-pay | Admitting: Surgery

## 2024-01-19 ENCOUNTER — Ambulatory Visit: Payer: Self-pay | Admitting: Surgery

## 2024-02-29 ENCOUNTER — Encounter (HOSPITAL_COMMUNITY): Payer: Self-pay | Admitting: Surgery

## 2024-02-29 NOTE — Progress Notes (Signed)
 Per patient, surgery has been rescheduled to 9/19.  Will call office on Tuesday and confirm

## 2024-03-18 ENCOUNTER — Encounter (HOSPITAL_BASED_OUTPATIENT_CLINIC_OR_DEPARTMENT_OTHER): Payer: Self-pay | Admitting: Surgery

## 2024-03-22 ENCOUNTER — Ambulatory Visit (HOSPITAL_BASED_OUTPATIENT_CLINIC_OR_DEPARTMENT_OTHER): Admitting: Anesthesiology

## 2024-03-22 ENCOUNTER — Other Ambulatory Visit: Payer: Self-pay

## 2024-03-22 ENCOUNTER — Encounter (HOSPITAL_BASED_OUTPATIENT_CLINIC_OR_DEPARTMENT_OTHER): Payer: Self-pay | Admitting: Surgery

## 2024-03-22 ENCOUNTER — Ambulatory Visit (HOSPITAL_BASED_OUTPATIENT_CLINIC_OR_DEPARTMENT_OTHER)
Admission: RE | Admit: 2024-03-22 | Discharge: 2024-03-22 | Disposition: A | Discharge status: Home / Self Care | Attending: Surgery | Admitting: Surgery

## 2024-03-22 ENCOUNTER — Encounter (HOSPITAL_BASED_OUTPATIENT_CLINIC_OR_DEPARTMENT_OTHER)
Admission: RE | Disposition: A | Discharge status: Home / Self Care | Payer: Self-pay | Source: Home / Self Care | Attending: Surgery

## 2024-03-22 DIAGNOSIS — K219 Gastro-esophageal reflux disease without esophagitis: Secondary | ICD-10-CM | POA: Insufficient documentation

## 2024-03-22 DIAGNOSIS — N400 Enlarged prostate without lower urinary tract symptoms: Secondary | ICD-10-CM | POA: Insufficient documentation

## 2024-03-22 DIAGNOSIS — R569 Unspecified convulsions: Secondary | ICD-10-CM | POA: Diagnosis not present

## 2024-03-22 DIAGNOSIS — I10 Essential (primary) hypertension: Secondary | ICD-10-CM | POA: Diagnosis not present

## 2024-03-22 DIAGNOSIS — M1990 Unspecified osteoarthritis, unspecified site: Secondary | ICD-10-CM | POA: Insufficient documentation

## 2024-03-22 DIAGNOSIS — K432 Incisional hernia without obstruction or gangrene: Secondary | ICD-10-CM | POA: Diagnosis not present

## 2024-03-22 DIAGNOSIS — Z01818 Encounter for other preprocedural examination: Secondary | ICD-10-CM

## 2024-03-22 DIAGNOSIS — J45909 Unspecified asthma, uncomplicated: Secondary | ICD-10-CM | POA: Diagnosis not present

## 2024-03-22 DIAGNOSIS — F32A Depression, unspecified: Secondary | ICD-10-CM | POA: Diagnosis not present

## 2024-03-22 DIAGNOSIS — G894 Chronic pain syndrome: Secondary | ICD-10-CM | POA: Diagnosis not present

## 2024-03-22 DIAGNOSIS — F419 Anxiety disorder, unspecified: Secondary | ICD-10-CM | POA: Insufficient documentation

## 2024-03-22 DIAGNOSIS — R519 Headache, unspecified: Secondary | ICD-10-CM | POA: Diagnosis not present

## 2024-03-22 DIAGNOSIS — Z87891 Personal history of nicotine dependence: Secondary | ICD-10-CM | POA: Insufficient documentation

## 2024-03-22 DIAGNOSIS — D649 Anemia, unspecified: Secondary | ICD-10-CM | POA: Diagnosis not present

## 2024-03-22 HISTORY — DX: Gastro-esophageal reflux disease without esophagitis: K21.9

## 2024-03-22 HISTORY — DX: Unspecified asthma, uncomplicated: J45.909

## 2024-03-22 HISTORY — DX: Essential (primary) hypertension: I10

## 2024-03-22 HISTORY — DX: Unspecified osteoarthritis, unspecified site: M19.90

## 2024-03-22 HISTORY — PX: INCISIONAL HERNIA REPAIR: SHX193

## 2024-03-22 HISTORY — DX: Anxiety disorder, unspecified: F41.9

## 2024-03-22 SURGERY — REPAIR, HERNIA, INCISIONAL
Anesthesia: General

## 2024-03-22 MED ORDER — IBUPROFEN 800 MG PO TABS: 800.0000 mg | ORAL_TABLET | Freq: Three times a day (TID) | ORAL | 0 refills | Status: AC | PRN

## 2024-03-22 MED ORDER — LIDOCAINE 2% (20 MG/ML) 5 ML SYRINGE
INTRAMUSCULAR | Status: DC | PRN
  Administered 2024-03-22: 80 mg via INTRAVENOUS

## 2024-03-22 MED ORDER — FENTANYL CITRATE (PF) 100 MCG/2ML IJ SOLN
INTRAMUSCULAR | Status: AC
  Filled 2024-03-22: qty 2

## 2024-03-22 MED ORDER — KETOROLAC TROMETHAMINE 30 MG/ML IJ SOLN
INTRAMUSCULAR | Status: AC
  Filled 2024-03-22: qty 1

## 2024-03-22 MED ORDER — KETOROLAC TROMETHAMINE 30 MG/ML IJ SOLN
30.0000 mg | Freq: Once | INTRAMUSCULAR | Status: AC
  Administered 2024-03-22: 30 mg via INTRAVENOUS

## 2024-03-22 MED ORDER — OXYCODONE HCL 5 MG PO TABS: 5.0000 mg | ORAL_TABLET | Freq: Four times a day (QID) | ORAL | 0 refills | Status: AC | PRN

## 2024-03-22 MED ORDER — ACETAMINOPHEN 10 MG/ML IV SOLN
1000.0000 mg | Freq: Once | INTRAVENOUS | Status: AC
  Administered 2024-03-22: 1000 mg via INTRAVENOUS

## 2024-03-22 MED ORDER — DROPERIDOL 2.5 MG/ML IJ SOLN
0.6250 mg | Freq: Once | INTRAMUSCULAR | Status: AC | PRN
  Administered 2024-03-22: 0.625 mg via INTRAVENOUS

## 2024-03-22 MED ORDER — CEFAZOLIN SODIUM-DEXTROSE 2-3 GM-%(50ML) IV SOLR
INTRAVENOUS | Status: DC | PRN
  Administered 2024-03-22: 2 g via INTRAVENOUS

## 2024-03-22 MED ORDER — ONDANSETRON HCL 4 MG/2ML IJ SOLN
INTRAMUSCULAR | Status: DC | PRN
  Administered 2024-03-22: 4 mg via INTRAVENOUS

## 2024-03-22 MED ORDER — ROCURONIUM BROMIDE 10 MG/ML (PF) SYRINGE
PREFILLED_SYRINGE | INTRAVENOUS | Status: DC | PRN
  Administered 2024-03-22: 80 mg via INTRAVENOUS

## 2024-03-22 MED ORDER — CHLORHEXIDINE GLUCONATE CLOTH 2 % EX PADS: 6.0000 | MEDICATED_PAD | Freq: Once | CUTANEOUS | Status: DC

## 2024-03-22 MED ORDER — ONDANSETRON HCL 4 MG/2ML IJ SOLN: 4.0000 mg | Freq: Once | INTRAMUSCULAR | Status: DC | PRN

## 2024-03-22 MED ORDER — OXYCODONE HCL 5 MG/5ML PO SOLN: 5.0000 mg | Freq: Once | ORAL | Status: AC | PRN

## 2024-03-22 MED ORDER — OXYCODONE HCL 5 MG PO TABS
5.0000 mg | ORAL_TABLET | Freq: Once | ORAL | Status: AC | PRN
  Administered 2024-03-22: 5 mg via ORAL

## 2024-03-22 MED ORDER — DEXMEDETOMIDINE HCL IN NACL 80 MCG/20ML IV SOLN
INTRAVENOUS | Status: DC | PRN
Start: 2024-03-22 — End: 2024-03-22
  Administered 2024-03-22: 8 ug via INTRAVENOUS

## 2024-03-22 MED ORDER — FENTANYL CITRATE (PF) 100 MCG/2ML IJ SOLN
INTRAMUSCULAR | Status: DC | PRN
  Administered 2024-03-22: 100 ug via INTRAVENOUS

## 2024-03-22 MED ORDER — MIDAZOLAM HCL 2 MG/2ML IJ SOLN
INTRAMUSCULAR | Status: DC | PRN
  Administered 2024-03-22: 2 mg via INTRAVENOUS

## 2024-03-22 MED ORDER — PROPOFOL 10 MG/ML IV BOLUS
INTRAVENOUS | Status: DC | PRN
  Administered 2024-03-22: 200 mg via INTRAVENOUS

## 2024-03-22 MED ORDER — 0.9 % SODIUM CHLORIDE (POUR BTL) OPTIME
TOPICAL | Status: DC | PRN
  Administered 2024-03-22: 500 mL

## 2024-03-22 MED ORDER — HYDROMORPHONE HCL 1 MG/ML IJ SOLN
INTRAMUSCULAR | Status: AC
  Filled 2024-03-22: qty 0.5

## 2024-03-22 MED ORDER — LACTATED RINGERS IV SOLN: INTRAVENOUS | Status: DC

## 2024-03-22 MED ORDER — DROPERIDOL 2.5 MG/ML IJ SOLN
INTRAMUSCULAR | Status: AC
  Filled 2024-03-22: qty 2

## 2024-03-22 MED ORDER — ACETAMINOPHEN 10 MG/ML IV SOLN
INTRAVENOUS | Status: AC
  Filled 2024-03-22: qty 100

## 2024-03-22 MED ORDER — DEXAMETHASONE SODIUM PHOSPHATE 10 MG/ML IJ SOLN
INTRAMUSCULAR | Status: DC | PRN
  Administered 2024-03-22: 10 mg via INTRAVENOUS

## 2024-03-22 MED ORDER — MIDAZOLAM HCL 2 MG/2ML IJ SOLN
INTRAMUSCULAR | Status: AC
  Filled 2024-03-22: qty 2

## 2024-03-22 MED ORDER — SUGAMMADEX SODIUM 200 MG/2ML IV SOLN
INTRAVENOUS | Status: DC | PRN
  Administered 2024-03-22: 200 mg via INTRAVENOUS

## 2024-03-22 MED ORDER — CEFAZOLIN SODIUM-DEXTROSE 2-4 GM/100ML-% IV SOLN
2.0000 g | INTRAVENOUS | Status: DC
Start: 2024-03-22 — End: 2024-03-22

## 2024-03-22 MED ORDER — OXYCODONE HCL 5 MG PO TABS
ORAL_TABLET | ORAL | Status: AC
  Filled 2024-03-22: qty 1

## 2024-03-22 MED ORDER — PHENYLEPHRINE 80 MCG/ML (10ML) SYRINGE FOR IV PUSH (FOR BLOOD PRESSURE SUPPORT)
PREFILLED_SYRINGE | INTRAVENOUS | Status: DC | PRN
  Administered 2024-03-22: 160 ug via INTRAVENOUS

## 2024-03-22 MED ORDER — HYDROMORPHONE HCL 1 MG/ML IJ SOLN
0.2500 mg | INTRAMUSCULAR | Status: DC | PRN
  Administered 2024-03-22 (×4): 0.5 mg via INTRAVENOUS

## 2024-03-22 MED ORDER — BUPIVACAINE-EPINEPHRINE 0.25% -1:200000 IJ SOLN
INTRAMUSCULAR | Status: DC | PRN
  Administered 2024-03-22: 30 mL

## 2024-03-22 SURGICAL SUPPLY — 37 items
BLADE CLIPPER SURG (BLADE) IMPLANT
BLADE SURG 10 STRL SS (BLADE) ×1 IMPLANT
BLADE SURG 15 STRL LF DISP TIS (BLADE) ×1 IMPLANT
CANISTER SUCT 1200ML W/VALVE (MISCELLANEOUS) ×1 IMPLANT
CHLORAPREP W/TINT 26 (MISCELLANEOUS) ×1 IMPLANT
COVER BACK TABLE 60X90IN (DRAPES) ×1 IMPLANT
COVER MAYO STAND STRL (DRAPES) ×1 IMPLANT
DERMABOND ADVANCED .7 DNX12 (GAUZE/BANDAGES/DRESSINGS) ×1 IMPLANT
DRAPE LAPAROTOMY TRNSV 102X78 (DRAPES) ×1 IMPLANT
DRAPE UTILITY XL STRL (DRAPES) ×1 IMPLANT
ELECT COATED BLADE 2.86 ST (ELECTRODE) ×1 IMPLANT
ELECTRODE REM PT RTRN 9FT ADLT (ELECTROSURGICAL) ×1 IMPLANT
GLOVE BIOGEL PI IND STRL 8 (GLOVE) ×1 IMPLANT
GLOVE ECLIPSE 8.0 STRL XLNG CF (GLOVE) ×1 IMPLANT
GOWN STRL REUS W/ TWL LRG LVL3 (GOWN DISPOSABLE) ×2 IMPLANT
GOWN STRL REUS W/ TWL XL LVL3 (GOWN DISPOSABLE) ×1 IMPLANT
MESH OVITEX 1S PERM 6X10 6L (Mesh General) IMPLANT
NDL HYPO 22X1.5 SAFETY MO (MISCELLANEOUS) IMPLANT
NDL HYPO 25X1 1.5 SAFETY (NEEDLE) ×1 IMPLANT
NEEDLE HYPO 22X1.5 SAFETY MO (MISCELLANEOUS) IMPLANT
NEEDLE HYPO 25X1 1.5 SAFETY (NEEDLE) ×1 IMPLANT
NS IRRIG 1000ML POUR BTL (IV SOLUTION) IMPLANT
PACK BASIN DAY SURGERY FS (CUSTOM PROCEDURE TRAY) ×1 IMPLANT
PENCIL SMOKE EVACUATOR (MISCELLANEOUS) ×1 IMPLANT
SLEEVE SCD COMPRESS KNEE MED (STOCKING) IMPLANT
SPIKE FLUID TRANSFER (MISCELLANEOUS) ×1 IMPLANT
STAPLER SKIN PROX WIDE 3.9 (STAPLE) IMPLANT
SUT MON AB 4-0 PC3 18 (SUTURE) ×1 IMPLANT
SUT NOVA 0 T19/GS 22DT (SUTURE) IMPLANT
SUT NOVA NAB GS-21 1 T12 (SUTURE) IMPLANT
SUT SILK 3 0 SH 30 (SUTURE) IMPLANT
SUT VIC AB 2-0 SH 27XBRD (SUTURE) ×1 IMPLANT
SUT VICRYL 3-0 CR8 SH (SUTURE) IMPLANT
SYR CONTROL 10ML LL (SYRINGE) ×1 IMPLANT
TOWEL GREEN STERILE FF (TOWEL DISPOSABLE) ×1 IMPLANT
TUBE CONNECTING 20X1/4 (TUBING) ×1 IMPLANT
YANKAUER SUCT BULB TIP NO VENT (SUCTIONS) ×1 IMPLANT

## 2024-03-22 NOTE — Transfer of Care (Signed)
 Immediate Anesthesia Transfer of Care Note  Patient: Alan Nelson  Procedure(s) Performed: REPAIR, HERNIA, INCISIONAL  Patient Location: PACU  Anesthesia Type:General  Level of Consciousness: drowsy  Airway & Oxygen Therapy: Patient Spontanous Breathing and Patient connected to face mask oxygen  Post-op Assessment: Report given to RN and Post -op Vital signs reviewed and stable  Post vital signs: Reviewed and stable  Last Vitals:  Vitals Value Taken Time  BP 144/89 03/22/24 12:15  Temp 36.6 C 03/22/24 12:15  Pulse 64 03/22/24 12:17  Resp 12 03/22/24 12:17  SpO2 99 % 03/22/24 12:17  Vitals shown include unfiled device data.  Last Pain:  Vitals:   03/22/24 0856  TempSrc: Temporal  PainSc: 0-No pain      Patients Stated Pain Goal: 3 (03/22/24 0856)  Complications: No notable events documented.

## 2024-03-22 NOTE — Interval H&P Note (Signed)
 History and Physical Interval Note:  03/22/2024 9:48 AM  Alan Nelson  has presented today for surgery, with the diagnosis of INCISIONAL HERNIA.  The various methods of treatment have been discussed with the patient and family. After consideration of risks, benefits and other options for treatment, the patient has consented to  Procedure(s): REPAIR, HERNIA, INCISIONAL (N/A) as a surgical intervention.  The patient's history has been reviewed, patient examined, no change in status, stable for surgery.  I have reviewed the patient's chart and labs.  Questions were answered to the patient's satisfaction.   The risk of hernia repair include bleeding,  Infection,   Recurrence of the hernia,  Mesh use, chronic pain,  Organ injury,  Bowel injury,  Bladder injury,   nerve injury with numbness around the incision,  Death,  and worsening of preexisting  medical problems.  The alternatives to surgery have been discussed as well..  Long term expectations of both operative and non operative treatments have been discussed.   The patient agrees to proceed.   Carolle Ishii A Jaci Desanto

## 2024-03-22 NOTE — Op Note (Signed)
 Preoperative diagnosis: 3 cm periumbilical incisional hernia reducible initial  Postoperative diagnosis: Same  Procedure: Repair of periumbilical incisional hernia measuring 3 cm with ovitex 10 x 16 cm 1S mesh  Surgeon: Debby Shipper, MD  Anesthesia: General With 0.25% Marcaine  with epinephrine   EBL: Minimal  Drains: None  Specimens: None  Indications for procedure: The patient presents for repair of a small incisional hernia around his umbilicus.'s been present for many years and is secondary to a laparotomy from childhood.  He does have pain at the site.  CT scan only showed the periumbilical defect.  We discussed open laparoscopic repairs.  After discussion of both types repairs the pros and cons of each the patient chose an open repair due to the small hernia size.  We discussed recurrence rates, mesh use, the different techniques available and the pros and cons of each.The risk of hernia repair include bleeding,  Infection,   Recurrence of the hernia,  Mesh use, chronic pain,  Organ injury,  Bowel injury,  Bladder injury,   nerve injury with numbness around the incision,  Death,  and worsening of preexisting  medical problems.  The alternatives to surgery have been discussed as well..  Long term expectations of both operative and non operative treatments have been discussed.   The patient agrees to proceed.    Description of the procedure: The patient was met in the holding area and all questions were answered.  He was taken back to the operative room and placed upon upon the operating room table.  After induction of general anesthesia, the abdomen was prepped and draped in a sterile fashion and timeout performed.  The hernia was noted at the umbilicus itself.  An incision was made 3 cm above and below the umbilicus.  The subcu tissues were opened with cautery down to the hernia sac which was opened which contained some omentum.  This was mostly at the umbilicus.  I reduced this back into  the abdominal cavity after using sharp dissection and cautery to dissect the omentum off the hernia sac.  The defect measured roughly 3 x 3 cm.  I elevated the fascia and use my finger to sweep around underneath it.  There is some omental adhesions and I cleared a subfascial space of 4 to 5 cm in all directions.  I palpated up and down the incision and could not feel any other defects going more cranial or caudal.  A 10 x 16 piece of ovitex 1S mesh was used and placed in a subfascial position.  Fascial sutures were placed to circumferentially affix the mesh to the undersurface of the fascia.  Orientation was smooth down.  Once the sutures were all tied down I felt around and felt no gaps.  There is no signs of any bleeding.  I then closed the fascia with #1 Novafil which was the same used to secure the mesh.  There is a significant amount of subcu dissection and this was all secured back down using interrupted 3-0 Vicryl.  The umbilicus was reaffixed the fascia with a 3-0 Vicryl.  4-0 Monocryl was used to close the skin.  Of note local anesthetic was used throughout the case.  All counts were found to be correct.  Abdominal binder placed.  The patient was then awoke extubated taken to recovery in satisfactory condition.

## 2024-03-22 NOTE — Anesthesia Procedure Notes (Signed)
 Procedure Name: Intubation Date/Time: 03/22/2024 10:36 AM  Performed by: Leotha Andrez DEL, CRNAPre-anesthesia Checklist: Patient identified, Emergency Drugs available, Suction available, Patient being monitored and Timeout performed Patient Re-evaluated:Patient Re-evaluated prior to induction Oxygen Delivery Method: Circle system utilized Preoxygenation: Pre-oxygenation with 100% oxygen Induction Type: IV induction Ventilation: Two handed mask ventilation required and Oral airway inserted - appropriate to patient size Laryngoscope Size: Glidescope and 4 Grade View: Grade I Tube type: Oral Tube size: 7.5 mm Number of attempts: 1 Airway Equipment and Method: Stylet Placement Confirmation: ETT inserted through vocal cords under direct vision, positive ETCO2 and breath sounds checked- equal and bilateral Secured at: 22 cm Tube secured with: Tape Dental Injury: Teeth and Oropharynx as per pre-operative assessment

## 2024-03-22 NOTE — Anesthesia Preprocedure Evaluation (Addendum)
 Anesthesia Evaluation  Patient identified by MRN, date of birth, ID band Patient awake    Reviewed: Allergy & Precautions, NPO status , Patient's Chart, lab work & pertinent test results  Airway Mallampati: II  TM Distance: >3 FB     Dental  (+) Edentulous Upper, Missing, Dental Advisory Given,    Pulmonary asthma , former smoker   Pulmonary exam normal breath sounds clear to auscultation       Cardiovascular hypertension, Normal cardiovascular exam Rhythm:Regular Rate:Normal     Neuro/Psych  Headaches, Seizures -, Well Controlled,  PSYCHIATRIC DISORDERS Anxiety Depression     Neuromuscular disease    GI/Hepatic ,GERD  Medicated,,(+)     substance abuse  cocaine use  Endo/Other    Renal/GU negative Renal ROS   BPH    Musculoskeletal  (+) Arthritis , Osteoarthritis,    Abdominal   Peds  Hematology  (+) Blood dyscrasia, anemia   Anesthesia Other Findings   Reproductive/Obstetrics                              Anesthesia Physical Anesthesia Plan  ASA: 2  Anesthesia Plan: General   Post-op Pain Management: Minimal or no pain anticipated   Induction: Intravenous  PONV Risk Score and Plan: 3 and Treatment may vary due to age or medical condition  Airway Management Planned: Oral ETT  Additional Equipment: None  Intra-op Plan:   Post-operative Plan: Extubation in OR  Informed Consent: I have reviewed the patients History and Physical, chart, labs and discussed the procedure including the risks, benefits and alternatives for the proposed anesthesia with the patient or authorized representative who has indicated his/her understanding and acceptance.     Dental advisory given  Plan Discussed with: CRNA and Anesthesiologist  Anesthesia Plan Comments:          Anesthesia Quick Evaluation

## 2024-03-22 NOTE — Discharge Instructions (Addendum)
 [facility]__[facility]  UMBILICAL OR INGUINAL HERNIA REPAIR: POST OP INSTRUCTIONS  Always review your discharge instruction sheet given to you by the facility where your surgery was performed. IF YOU HAVE DISABILITY OR FAMILY LEAVE FORMS, YOU MUST BRING THEM TO THE OFFICE FOR PROCESSING.   DO NOT GIVE THEM TO YOUR DOCTOR.  1. A  prescription for pain medication may be given to you upon discharge.  Take your pain medication as prescribed, if needed.  If narcotic pain medicine is not needed, then you may take acetaminophen  (Tylenol ) or ibuprofen (Advil) as needed. 2. Take your usually prescribed medications unless otherwise directed. If you need a refill on your pain medication, please contact your pharmacy.  They will contact our office to request authorization. Prescriptions will not be filled after 5 pm or on week-ends. 3. You should follow a light diet the first 24 hours after arrival home, such as soup and crackers, etc.  Be sure to include lots of fluids daily.  Resume your normal diet the day after surgery. 4.Most patients will experience some swelling and bruising around the umbilicus or in the groin and scrotum.  Ice packs and reclining will help.  Swelling and bruising can take several days to resolve.  6. It is common to experience some constipation if taking pain medication after surgery.  Increasing fluid intake and taking a stool softener (such as Colace) will usually help or prevent this problem from occurring.  A mild laxative (Milk of Magnesia or Miralax) should be taken according to package directions if there are no bowel movements after 48 hours. 7. Unless discharge instructions indicate otherwise, you may remove your bandages 24-48 hours after surgery, and you may shower at that time.  You may have steri-strips (small skin tapes) in place directly over the incision.  These strips should be left on the skin for 7-10 days.  If your surgeon used skin glue on the  incision, you may shower in 24 hours.  The glue will flake off over the next 2-3 weeks.  Any sutures or staples will be removed at the office during your follow-up visit. 8. ACTIVITIES:  You may resume regular (light) daily activities beginning the next day--such as daily self-care, walking, climbing stairs--gradually increasing activities as tolerated.  You may have sexual intercourse when it is comfortable.  Refrain from any heavy lifting or straining until approved by your doctor.  a.You may drive when you are no longer taking prescription pain medication, you can comfortably wear a seatbelt, and you can safely maneuver your car and apply brakes. b.RETURN TO WORK:   _____________________________________________  9.You should see your doctor in the office for a follow-up appointment approximately 2-3 weeks after your surgery.  Make sure that you call for this appointment within a day or two after you arrive home to insure a convenient appointment time. 10.OTHER INSTRUCTIONS: _________________________    _____________________________________  WHEN TO CALL YOUR DOCTOR: Fever over 101.0 Inability to urinate Nausea and/or vomiting Extreme swelling or bruising Continued bleeding from incision. Increased pain, redness, or drainage from the incision  The clinic staff is available to answer your questions during regular business hours.  Please don't hesitate to call and ask to speak to one of the nurses for clinical concerns.  If you have a medical emergency, go to the nearest emergency room or call 911.  A surgeon from [facility] is always on call at the hospital   [address],  [place], [place]  [address] ?  [address] [address], [place], [place]   [address] [phone] ? [phone] ? FAX [phone] Web site: [email or web address]    Post Anesthesia Home Care Instructions  Activity: Get plenty of rest for the remainder of the day. A responsible  individual must stay with you for 24 hours following the procedure.  For the next 24 hours, DO NOT: -Drive a car -Advertising copywriter -Drink alcoholic beverages -Take any medication unless instructed by your physician -Make any legal decisions or sign important papers.  Meals: Start with liquid foods such as gelatin or soup. Progress to regular foods as tolerated. Avoid greasy, spicy, heavy foods. If nausea and/or vomiting occur, drink only clear liquids until the nausea and/or vomiting subsides. Call your physician if vomiting continues.  Special Instructions/Symptoms: Your throat may feel dry or sore from the anesthesia or the breathing tube placed in your throat during surgery. If this causes discomfort, gargle with warm salt water. The discomfort should disappear within 24 hours.  If you had a scopolamine patch placed behind your ear for the management of post- operative nausea and/or vomiting:  1. The medication in the patch is effective for 72 hours, after which it should be removed.  Wrap patch in a tissue and discard in the trash. Wash hands thoroughly with soap and water. 2. You may remove the patch earlier than 72 hours if you experience unpleasant side effects which may include dry mouth, dizziness or visual disturbances. 3. Avoid touching the patch. Wash your hands with soap and water after contact with the patch.    Last received tylenol  at 1pm. May have after 7pm if needed. Last received Toradol at 1pm. May have ibuprofen/Motrin after 9pm if needed.

## 2024-03-22 NOTE — Anesthesia Postprocedure Evaluation (Signed)
 Anesthesia Post Note  Patient: Alan Nelson  Procedure(s) Performed: REPAIR, HERNIA, INCISIONAL     Patient location during evaluation: PACU Anesthesia Type: General Level of consciousness: awake and alert and oriented Pain management: pain level controlled Vital Signs Assessment: post-procedure vital signs reviewed and stable Respiratory status: spontaneous breathing, nonlabored ventilation and respiratory function stable Cardiovascular status: blood pressure returned to baseline and stable Postop Assessment: no apparent nausea or vomiting Anesthetic complications: no   No notable events documented.  Last Vitals:  Vitals:   03/22/24 1245 03/22/24 1300  BP: (!) 142/90 (!) 148/92  Pulse: 69 72  Resp: 12 12  Temp:    SpO2: 98% 97%    Last Pain:  Vitals:   03/22/24 1307  TempSrc:   PainSc: 8                  [name]
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
## 2024-03-23 ENCOUNTER — Encounter (HOSPITAL_BASED_OUTPATIENT_CLINIC_OR_DEPARTMENT_OTHER): Payer: Self-pay | Admitting: Surgery
